# Patient Record
Sex: Female | Born: 1980 | Race: Black or African American | Hispanic: No | Marital: Married | Smoking: Never smoker
Health system: Southern US, Community
[De-identification: ages and names within clinical notes are randomized; demographics above are authoritative.]

## PROBLEM LIST (undated history)

## (undated) DIAGNOSIS — G459 Transient cerebral ischemic attack, unspecified: Secondary | ICD-10-CM

## (undated) DIAGNOSIS — R4701 Aphasia: Secondary | ICD-10-CM

## (undated) DIAGNOSIS — K829 Disease of gallbladder, unspecified: Secondary | ICD-10-CM

## (undated) DIAGNOSIS — N920 Excessive and frequent menstruation with regular cycle: Secondary | ICD-10-CM

## (undated) DIAGNOSIS — I639 Cerebral infarction, unspecified: Secondary | ICD-10-CM

## (undated) DIAGNOSIS — G510 Bell's palsy: Secondary | ICD-10-CM

## (undated) HISTORY — PX: KNEE SURGERY: SHX244

## (undated) HISTORY — DX: Aphasia: R47.01

## (undated) HISTORY — PX: OVARIAN CYST SURGERY: SHX726

## (undated) HISTORY — DX: Transient cerebral ischemic attack, unspecified: G45.9

---

## 2002-12-23 ENCOUNTER — Emergency Department (HOSPITAL_COMMUNITY): Admission: EM | Admit: 2002-12-23 | Discharge: 2002-12-23 | Payer: Self-pay | Admitting: Emergency Medicine

## 2003-04-27 ENCOUNTER — Emergency Department (HOSPITAL_COMMUNITY): Admission: EM | Admit: 2003-04-27 | Discharge: 2003-04-27 | Payer: Self-pay | Admitting: Emergency Medicine

## 2004-12-22 ENCOUNTER — Emergency Department: Payer: Self-pay | Admitting: Emergency Medicine

## 2011-12-09 ENCOUNTER — Encounter (HOSPITAL_COMMUNITY): Payer: Self-pay | Admitting: *Deleted

## 2011-12-09 ENCOUNTER — Emergency Department (HOSPITAL_COMMUNITY)

## 2011-12-09 ENCOUNTER — Inpatient Hospital Stay (HOSPITAL_COMMUNITY)
Admission: EM | Admit: 2011-12-09 | Discharge: 2011-12-12 | DRG: 880 | Disposition: A | Discharge status: Home / Self Care | Source: Ambulatory Visit | Attending: Internal Medicine | Admitting: Internal Medicine

## 2011-12-09 DIAGNOSIS — G459 Transient cerebral ischemic attack, unspecified: Secondary | ICD-10-CM | POA: Diagnosis present

## 2011-12-09 DIAGNOSIS — R4701 Aphasia: Secondary | ICD-10-CM | POA: Diagnosis present

## 2011-12-09 DIAGNOSIS — R1310 Dysphagia, unspecified: Secondary | ICD-10-CM | POA: Diagnosis present

## 2011-12-09 DIAGNOSIS — R51 Headache: Secondary | ICD-10-CM

## 2011-12-09 DIAGNOSIS — F449 Dissociative and conversion disorder, unspecified: Principal | ICD-10-CM | POA: Diagnosis present

## 2011-12-09 DIAGNOSIS — Z882 Allergy status to sulfonamides status: Secondary | ICD-10-CM

## 2011-12-09 DIAGNOSIS — G510 Bell's palsy: Secondary | ICD-10-CM | POA: Diagnosis present

## 2011-12-09 DIAGNOSIS — R29810 Facial weakness: Secondary | ICD-10-CM | POA: Diagnosis present

## 2011-12-09 HISTORY — DX: Bell's palsy: G51.0

## 2011-12-09 HISTORY — DX: Cerebral infarction, unspecified: I63.9

## 2011-12-09 LAB — COMPREHENSIVE METABOLIC PANEL
AST: 21 U/L (ref 0–37)
Albumin: 4.2 g/dL (ref 3.5–5.2)
Alkaline Phosphatase: 83 U/L (ref 39–117)
Chloride: 102 mEq/L (ref 96–112)
Potassium: 3.5 mEq/L (ref 3.5–5.1)
Sodium: 136 mEq/L (ref 135–145)
Total Bilirubin: 0.2 mg/dL — ABNORMAL LOW (ref 0.3–1.2)
Total Protein: 8.4 g/dL — ABNORMAL HIGH (ref 6.0–8.3)

## 2011-12-09 LAB — CBC
Hemoglobin: 13.5 g/dL (ref 12.0–15.0)
MCHC: 35 g/dL (ref 30.0–36.0)
Platelets: 288 10*3/uL (ref 150–400)
RDW: 13.1 % (ref 11.5–15.5)

## 2011-12-09 LAB — CK TOTAL AND CKMB (NOT AT ARMC): CK, MB: 1.4 ng/mL (ref 0.3–4.0)

## 2011-12-09 LAB — PROTIME-INR
INR: 1.13 (ref 0.00–1.49)
Prothrombin Time: 14.7 seconds (ref 11.6–15.2)

## 2011-12-09 LAB — DIFFERENTIAL
Basophils Absolute: 0 10*3/uL (ref 0.0–0.1)
Basophils Relative: 0 % (ref 0–1)
Neutro Abs: 6.6 10*3/uL (ref 1.7–7.7)
Neutrophils Relative %: 58 % (ref 43–77)

## 2011-12-09 LAB — POCT I-STAT, CHEM 8
Creatinine, Ser: 0.7 mg/dL (ref 0.50–1.10)
Glucose, Bld: 98 mg/dL (ref 70–99)
Hemoglobin: 14.3 g/dL (ref 12.0–15.0)
Potassium: 3.6 mEq/L (ref 3.5–5.1)

## 2011-12-09 LAB — APTT: aPTT: 30 seconds (ref 24–37)

## 2011-12-09 LAB — TROPONIN I: Troponin I: <0.3 ng/mL (ref <0.30)

## 2011-12-09 MED ORDER — SODIUM CHLORIDE 0.9 % IV SOLN
INTRAVENOUS | Status: DC
  Administered 2011-12-10 (×2): via INTRAVENOUS

## 2011-12-09 MED ORDER — SODIUM CHLORIDE 0.9 % IV SOLN: INTRAVENOUS | Status: DC

## 2011-12-09 NOTE — Consult Note (Signed)
Referring Physician: Dr.Bednar    Chief Complaint: stroke code  HPI: Kathryn Harding is an 31 y.o. female black who is presenting in the ER with left-sided numbness in the face. The patient has very little speech output so some of the history is coming from her mother. Her mother tells me that the patient arrived home from [place] today around 9:00pm. Around 10 in the evening she started "acting funny in her face". She started twitching on the left side of her face and neck. Her mother tells me that she had Bell's palsy in 2007 in [place] and was treated with steroids.  Her mother was told by a  sargeant later that it was actually a stroke.  Since that time the patient has had some twitching on the left side on occasion. However, this is much more extensive and unusual. The patient stopped speaking at one point at her home. Here in the ER the patient is able to answer my questions with gestures as well as using writing. In the course of the history she is able to speak a little more. The patient tells me that the left side of her face is numb and that is why she is unable to speak. She is also having trouble swallowing. When I ask her if this occurred before she writes on paper that this occurred in 2007 in [place]. Pt is not on a blood thinner.  LSN: 10:00pm, 12/09/2011  tPA Given: No: CTangio head and neck negative for clot/cva mRankin:  Past Medical History  Diagnosis Date  . Bell's palsy   . CVA (cerebral infarction)     History reviewed. No pertinent past surgical history.  History reviewed. No pertinent family history. Social History:  does not have a smoking history on file. She does not have any smokeless tobacco history on file. Her alcohol and drug histories not on file.  Allergies:  Allergies  Allergen Reactions  . Sulfur Other (See Comments)    unknown    Medications: I have reviewed the patient's current medications.  ROS: Unable to obtain  Physical Examination: Blood  pressure 122/84, temperature 98 F (36.7 C), temperature source Oral, resp. rate 16, SpO2 100.00%. General: alert and oriented to P/P/P CV: S1, S2, no S3/S4/murmur/carotid bruits Pulm: Clear to auscultation in the front fields Abd: soft  Neurologic Examination: Naming/repetition/comprehension intact, speech dysarthric and speech output significantly decreased but fluctuating throughout exam and history, able to write, unable to read; PERRL, EOMI, V1-V3 decreased to ST/PP on L, L facial droop, hearing grossly intact, SCM/trap 5/5 R, SCM 3/5 L, trap 5/5 L; tongue deviated to the left MOTOR: 5/5, normal tone Sensory: intact to ST/PP all 4 limbs Reflexes: symmetric   Results for orders placed during the hospital encounter of 12/09/11 (from the past 48 hour(s))  PROTIME-INR     Status: Normal   Collection Time   12/09/11 11:15 PM      Component Value Range Comment   Prothrombin Time 14.7  11.6 - 15.2 (seconds)    INR 1.13  0.00 - 1.49    APTT     Status: Normal   Collection Time   12/09/11 11:15 PM      Component Value Range Comment   aPTT 30  24 - 37 (seconds)   CBC     Status: Abnormal   Collection Time   12/09/11 11:15 PM      Component Value Range Comment   WBC 11.4 (*) 4.0 - 10.5 (K/uL)  RBC 4.44  3.87 - 5.11 (MIL/uL)    Hemoglobin 13.5  12.0 - 15.0 (g/dL)    HCT 16.1  09.6 - 04.5 (%)    MCV 86.9  78.0 - 100.0 (fL)    MCH 30.4  26.0 - 34.0 (pg)    MCHC 35.0  30.0 - 36.0 (g/dL)    RDW 40.9  81.1 - 91.4 (%)    Platelets 288  150 - 400 (K/uL)   DIFFERENTIAL     Status: Abnormal   Collection Time   12/09/11 11:15 PM      Component Value Range Comment   Neutrophils Relative 58  43 - 77 (%)    Neutro Abs 6.6  1.7 - 7.7 (K/uL)    Lymphocytes Relative 30  12 - 46 (%)    Lymphs Abs 3.4  0.7 - 4.0 (K/uL)    Monocytes Relative 11  3 - 12 (%)    Monocytes Absolute 1.2 (*) 0.1 - 1.0 (K/uL)    Eosinophils Relative 1  0 - 5 (%)    Eosinophils Absolute 0.2  0.0 - 0.7 (K/uL)    Basophils  Relative 0  0 - 1 (%)    Basophils Absolute 0.0  0.0 - 0.1 (K/uL)   POCT I-STAT, CHEM 8     Status: Normal   Collection Time   12/09/11 11:28 PM      Component Value Range Comment   Sodium 141  135 - 145 (mEq/L)    Potassium 3.6  3.5 - 5.1 (mEq/L)    Chloride 105  96 - 112 (mEq/L)    BUN 13  6 - 23 (mg/dL)    Creatinine, Ser 7.82  0.50 - 1.10 (mg/dL)    Glucose, Bld 98  70 - 99 (mg/dL)    Calcium, Ion 9.56  1.12 - 1.32 (mmol/L)    TCO2 24  0 - 100 (mmol/L)    Hemoglobin 14.3  12.0 - 15.0 (g/dL)    HCT 21.3  08.6 - 57.8 (%)    Ct Head Wo Contrast  12/09/2011  *RADIOLOGY REPORT*  Clinical Data: Aphasia.  Left sided facial weakness.  Headache.  CT HEAD WITHOUT CONTRAST  Technique:  Contiguous axial images were obtained from the base of the skull through the vertex without contrast.  Comparison: None.  Findings: No acute intracranial hemorrhage, infarction, or mass. Brain parenchyma is normal.  Osseous structures are normal.  IMPRESSION: Normal exam.  Original Report Authenticated By: Gwynn Burly, M.D.    Assessment: 31 y.o. female black PMH Bell's palsy and FMH positive for CVA presenting ER with sudden onset L facial droop, PE inconsistent, CT head/CTA head and neck negative for CVA/clot  Stroke Risk Factors - family history  Plan: 1. HgbA1c, fasting lipid panel 2. MRI, MRA  of the brain without contrast 3. PT consult, OT consult, Speech consult 4. Echocardiogram 5. Carotid dopplers 6. Prophylactic therapy-Antiplatelet med: Aspirin - dose 81mg  7. Risk factor modification 8. Telemetry monitoring 9. UDS  Shlomo Seres Christophe Louis, MD [facility]  12/09/2011, 11:48 PM

## 2011-12-09 NOTE — ED Notes (Signed)
Per EMS: pt noted to have sudden onset of HA, and left sided facial droop, numbness and weakness tonight at 2200.  Also states pt stopped speaking in front of mom and has not spoken since at 2200.

## 2011-12-09 NOTE — ED Provider Notes (Signed)
History     CSN: [id number]  Arrival date & time 12/09/11  2309   First MD Initiated Contact with Patient 12/09/11 2312      Chief Complaint  Patient presents with  . Code Stroke  left face weak/numb/headache aphasia onset 2200  (Consider location/radiation/quality/duration/timing/severity/associated sxs/prior treatment) HPI This 31 year old female is brought to the emergency room by EMS for sudden onset at 10:00 this evening and just over 1 hour prior to arrival of a headache associated with left-sided facial weakness and numbness and inability to speak but no receptive aphasia she is following commands and has no lateralizing weakness or numbness or incoordination of her arms or legs. She has no change in vision. She has no chest pain or shortness of breath.  Review of systems otherwise unobtainable due to severe illness riding as a code stroke. Per EMS the patient apparently has a history of both a stroke in the past as well as Bell's palsy she has recovered from both at baseline has no deficits according to EMS from the history obtained from the family prior to arrival. Past Medical History  Diagnosis Date  . Bell's palsy   . CVA (cerebral infarction)    CVA, Bell's palsy, apparently both recovered fully at baseline no deficits per EMS History reviewed. No pertinent past surgical history.  History reviewed. No pertinent family history.  History  Substance Use Topics  . Smoking status: Never Smoker   . Smokeless tobacco: Not on file  . Alcohol Use: Not on file    OB History    Grav Para Term Preterm Abortions TAB SAB Ect Mult Living                  Review of Systems  Unable to perform ROS: Other    Allergies  Sulfur  Home Medications  No current outpatient prescriptions on file.  BP 99/62  Pulse 65  Temp(Src) 97.7 F (36.5 C) (Oral)  Resp 16  Ht 5\' 1"  (1.549 m)  Wt 225 lb 8.5 oz (102.3 kg)  BMI 42.61 kg/m2  SpO2 94%  LMP 12/03/2011  Physical Exam    Nursing note and vitals reviewed. Constitutional:       Awake, alert, nontoxic appearance, follows simple commands well  HENT:  Head: Atraumatic.  Mouth/Throat: No oropharyngeal exudate.  Eyes: EOM are normal. Pupils are equal, round, and reactive to light. Right eye exhibits no discharge. Left eye exhibits no discharge.  Neck: Neck supple.  Cardiovascular: Normal rate and regular rhythm.   No murmur heard. Pulmonary/Chest: Effort normal and breath sounds normal. No stridor. No respiratory distress. She has no wheezes. She has no rales. She exhibits no tenderness.  Abdominal: Soft. Bowel sounds are normal. She exhibits no mass. There is no tenderness. There is no rebound.  Musculoskeletal: She exhibits no edema and no tenderness.       Baseline ROM, moves extremities with no obvious new focal weakness.  Lymphadenopathy:    She has no cervical adenopathy.  Neurological: She is alert.       Awake, alert, cooperative and aware of situation; motor strength bilaterally arms and legs; sensation normal to light touch bilaterally arms and legs; peripheral visual fields full to confrontation; has facial asymmetry; tongue midline; major cranial nerves appear to show a left facial numbness and droop not involving the eyelid or forehead; no pronator drift arms or legs, normal finger to nose bilaterally; no apparent receptive aphasia but is unable to speak upon arrival  Skin:  No rash noted.  Psychiatric: She has a normal mood and affect.    ED Course  Procedures (including critical care time) ECG: Sinus rhythm, ventricular rate 72, normal axis, borderline AV conduction delay with PR interval 200 ms, no acute ischemic changes noted, no comparison ECG available  0015 patient is alert he been seen by neurology in his back and CT angiogram, I discussed the case with the patient's family as well as it is uncertain whether or not the patient will be a candidate for TPA or other intervention due to the  somewhat atypical symptoms with Pt's speech improving according to neurology in the ED and she was unable to speak upon arrival but started talking to the neurologist.  0130 unassigned medicine paged after neurology determined the patient is not a candidate for intervention or tPA with a normal CT angiogram of the brain, patient is able to speak a little bit now in the emergency department which is an improvement, patient and her family understand and agree with this assessment and plan as well, the patient is active duty in the [employer] but does not have a local primary care doctor Labs Reviewed  CBC - Abnormal; Notable for the following:    WBC 11.4 (*)    All other components within normal limits  DIFFERENTIAL - Abnormal; Notable for the following:    Monocytes Absolute 1.2 (*)    All other components within normal limits  COMPREHENSIVE METABOLIC PANEL - Abnormal; Notable for the following:    Total Protein 8.4 (*)    Total Bilirubin 0.2 (*)    All other components within normal limits  GLUCOSE, CAPILLARY - Abnormal; Notable for the following:    Glucose-Capillary 107 (*)    All other components within normal limits  PROTIME-INR  APTT  CK TOTAL AND CKMB  TROPONIN I  POCT I-STAT, CHEM 8  URINE RAPID DRUG SCREEN (HOSP PERFORMED)  HEMOGLOBIN A1C  LIPID PANEL  RAPID HIV SCREEN ([facility])  LYME DISEASE DNA BY PCR([facility])   Ct Angio Head W/cm &/or Wo Cm  12/10/2011  *RADIOLOGY REPORT*  Clinical Data:  Right facial droop.  Slurred speech.  Tongue deviation to the left.  CT ANGIOGRAPHY HEAD AND NECK  Technique:  Multidetector CT imaging of the head and neck was performed using the standard protocol during bolus administration of intravenous contrast.  Multiplanar CT image reconstructions including MIPs were obtained to evaluate the vascular anatomy. Carotid stenosis measurements (when applicable) are obtained utilizing NASCET criteria, using the distal internal carotid diameter as the  denominator.  Contrast: ,50mL OMNIPAQUE IOHEXOL 350 MG/ML IV SOLN  Comparison:  CT head without contrast 12/08/2010.  CTA NECK  Findings:  There is a common origin of the left common carotid artery and the innominate artery.  The arch is otherwise unremarkable.  Both vertebral arteries originate from the subclavian arteries.  The left vertebral artery is the dominant vessel.  The the right vertebral artery is hypoplastic throughout its course in the neck.  There is no focal stenosis on either side.  The right common carotid artery is within normal limits.  The bifurcation is unremarkable.  The right internal carotid artery is normal.  The left common carotid artery is within normal limits.  The bifurcation is unremarkable.  The left internal carotid artery is normal.  The neck is otherwise unremarkable.  The lung apices are clear.   Review of the MIP images confirms the above findings.  IMPRESSION: Negative CTA of the neck.  CTA HEAD  Findings:  The internal carotid arteries are within normal limits bilaterally at the skull base.  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 The anterior communicating artery is patent and both A2 vessels fill from the left.  The M1 segments are normal.  The MCA bifurcations are within normal limits bilaterally.  ACA and MCA branch vessels are unremarkable.  The left vertebral artery is the dominant vessel.  The left PICA origin is visualized and within normal limits.  The right AICA is dominant.  The basilar artery is within normal limits.  Both posterior cerebral arteries originate from the basilar tip.  The PCA branch vessels are within normal limits bilaterally.  The dural sinuses fill normally.  No acute infarct, hemorrhage, mass lesion is evident.  No pathologic enhancement is present.   Review of the MIP images confirms the above findings.  IMPRESSION: Negative CTA of the head.  Original Report Authenticated By: Jamesetta Orleans. MATTERN, M.D.   Ct Head Wo Contrast  12/09/2011  *RADIOLOGY  REPORT*  Clinical Data: Aphasia.  Left sided facial weakness.  Headache.  CT HEAD WITHOUT CONTRAST  Technique:  Contiguous axial images were obtained from the base of the skull through the vertex without contrast.  Comparison: None.  Findings: No acute intracranial hemorrhage, infarction, or mass. Brain parenchyma is normal.  Osseous structures are normal.  IMPRESSION: Normal exam.  Original Report Authenticated By: Gwynn Burly, M.D.   Ct Angio Neck W/cm &/or Wo/cm  12/10/2011  *RADIOLOGY REPORT*  Clinical Data:  Right facial droop.  Slurred speech.  Tongue deviation to the left.  CT ANGIOGRAPHY HEAD AND NECK  Technique:  Multidetector CT imaging of the head and neck was performed using the standard protocol during bolus administration of intravenous contrast.  Multiplanar CT image reconstructions including MIPs were obtained to evaluate the vascular anatomy. Carotid stenosis measurements (when applicable) are obtained utilizing NASCET criteria, using the distal internal carotid diameter as the denominator.  Contrast: ,50mL OMNIPAQUE IOHEXOL 350 MG/ML IV SOLN  Comparison:  CT head without contrast 12/08/2010.  CTA NECK  Findings:  There is a common origin of the left common carotid artery and the innominate artery.  The arch is otherwise unremarkable.  Both vertebral arteries originate from the subclavian arteries.  The left vertebral artery is the dominant vessel.  The the right vertebral artery is hypoplastic throughout its course in the neck.  There is no focal stenosis on either side.  The right common carotid artery is within normal limits.  The bifurcation is unremarkable.  The right internal carotid artery is normal.  The left common carotid artery is within normal limits.  The bifurcation is unremarkable.  The left internal carotid artery is normal.  The neck is otherwise unremarkable.  The lung apices are clear.   Review of the MIP images confirms the above findings.  IMPRESSION: Negative CTA of the  neck.  CTA HEAD  Findings:  The internal carotid arteries are within normal limits bilaterally at the skull base.  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 The anterior communicating artery is patent and both A2 vessels fill from the left.  The M1 segments are normal.  The MCA bifurcations are within normal limits bilaterally.  ACA and MCA branch vessels are unremarkable.  The left vertebral artery is the dominant vessel.  The left PICA origin is visualized and within normal limits.  The right AICA is dominant.  The basilar artery is within normal limits.  Both posterior cerebral arteries  originate from the basilar tip.  The PCA branch vessels are within normal limits bilaterally.  The dural sinuses fill normally.  No acute infarct, hemorrhage, mass lesion is evident.  No pathologic enhancement is present.   Review of the MIP images confirms the above findings.  IMPRESSION: Negative CTA of the head.  Original Report Authenticated By: Jamesetta Orleans. MATTERN, M.D.   Mr Brain Wo Contrast  12/10/2011  *RADIOLOGY REPORT*  Clinical Data:  Stroke.  Aphasia preceded by twitching of the left side of face.  MRI HEAD WITHOUT CONTRAST MRA HEAD WITHOUT CONTRAST  Technique:  Multiplanar, multiecho pulse sequences of the brain and surrounding structures were obtained without intravenous contrast. Angiographic images of the head were obtained using MRA technique without contrast.  Comparison:  CTA head and neck 12/10/2011  MRI HEAD  Findings:  The diffusion weighted images demonstrate no evidence for acute or subacute infarction.  There is no evidence for significant to remote ischemia.  No hemorrhage or mass lesion is present.  The ventricles are of normal size.  No significant extra-axial fluid collection is present.  Flow is present in the major intracranial arteries.  The globes and orbits are intact.  The paranasal sinuses and mastoid air cells are clear.  IMPRESSION: Negative MRI of the brain.  MRA HEAD  Findings: The study is  mildly degraded by patient motion.  The internal carotid arteries are within normal limits bilaterally. 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  Both ACA vessels fill from the left via a patent anterior communicating artery.  The MCA bifurcations are normal.  The ACA and MCA branch vessels are within normal limits.  The left vertebral artery is the dominant vessel.  The basilar artery is normal.  Both posterior cerebral arteries originate from basilar tip.  The PCA branch vessels are normal.  IMPRESSION: Normal variant MRA circle of Willis without evidence for significant proximal stenosis, aneurysm, or branch vessel occlusion.  Original Report Authenticated By: Jamesetta Orleans. MATTERN, M.D.   Mr Maxine Glenn Head/brain Wo Cm  12/10/2011  *RADIOLOGY REPORT*  Clinical Data:  Stroke.  Aphasia preceded by twitching of the left side of face.  MRI HEAD WITHOUT CONTRAST MRA HEAD WITHOUT CONTRAST  Technique:  Multiplanar, multiecho pulse sequences of the brain and surrounding structures were obtained without intravenous contrast. Angiographic images of the head were obtained using MRA technique without contrast.  Comparison:  CTA head and neck 12/10/2011  MRI HEAD  Findings:  The diffusion weighted images demonstrate no evidence for acute or subacute infarction.  There is no evidence for significant to remote ischemia.  No hemorrhage or mass lesion is present.  The ventricles are of normal size.  No significant extra-axial fluid collection is present.  Flow is present in the major intracranial arteries.  The globes and orbits are intact.  The paranasal sinuses and mastoid air cells are clear.  IMPRESSION: Negative MRI of the brain.  MRA HEAD  Findings: The study is mildly degraded by patient motion.  The internal carotid arteries are within normal limits bilaterally. 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  Both ACA vessels fill from the left via a patent anterior communicating artery.  The MCA bifurcations are normal.  The ACA and MCA branch  vessels are within normal limits.  The left vertebral artery is the dominant vessel.  The basilar artery is normal.  Both posterior cerebral arteries originate from basilar tip.  The PCA branch vessels are normal.  IMPRESSION: Normal variant MRA circle of Willis  without evidence for significant proximal stenosis, aneurysm, or branch vessel occlusion.  Original Report Authenticated By: Jamesetta Orleans. MATTERN, M.D.     1. Headache   2. Aphasia   3. Facial weakness   4. TIA (transient ischemic attack)       MDM          Hurman Horn, MD 12/11/11 0230

## 2011-12-10 ENCOUNTER — Emergency Department (HOSPITAL_COMMUNITY)

## 2011-12-10 ENCOUNTER — Other Ambulatory Visit (HOSPITAL_COMMUNITY): Payer: Self-pay

## 2011-12-10 ENCOUNTER — Inpatient Hospital Stay (HOSPITAL_COMMUNITY)

## 2011-12-10 ENCOUNTER — Other Ambulatory Visit: Payer: Self-pay

## 2011-12-10 ENCOUNTER — Encounter (HOSPITAL_COMMUNITY): Payer: Self-pay | Admitting: Radiology

## 2011-12-10 DIAGNOSIS — G459 Transient cerebral ischemic attack, unspecified: Secondary | ICD-10-CM | POA: Diagnosis present

## 2011-12-10 DIAGNOSIS — R4701 Aphasia: Secondary | ICD-10-CM

## 2011-12-10 HISTORY — DX: Transient cerebral ischemic attack, unspecified: G45.9

## 2011-12-10 HISTORY — DX: Aphasia: R47.01

## 2011-12-10 LAB — HEMOGLOBIN A1C: Mean Plasma Glucose: 114 mg/dL (ref <117)

## 2011-12-10 LAB — LIPID PANEL
Cholesterol: 136 mg/dL (ref 0–200)
Total CHOL/HDL Ratio: 3.2 RATIO
VLDL: 13 mg/dL (ref 0–40)

## 2011-12-10 LAB — RAPID URINE DRUG SCREEN, HOSP PERFORMED
Amphetamines: NOT DETECTED
Benzodiazepines: NOT DETECTED
Opiates: NOT DETECTED

## 2011-12-10 LAB — GLUCOSE, CAPILLARY

## 2011-12-10 LAB — RAPID HIV SCREEN (WH-MAU): Rapid HIV Screen: NONREACTIVE

## 2011-12-10 MED ORDER — ALPRAZOLAM 0.5 MG PO TABS
0.5000 mg | ORAL_TABLET | Freq: Two times a day (BID) | ORAL | Status: DC | PRN
Start: 2011-12-10 — End: 2011-12-12
  Administered 2011-12-10 (×2): 0.5 mg via ORAL
  Filled 2011-12-10 (×2): qty 1

## 2011-12-10 MED ORDER — ACETAMINOPHEN 325 MG PO TABS: 650.0000 mg | ORAL_TABLET | ORAL | Status: DC | PRN

## 2011-12-10 MED ORDER — ACETAMINOPHEN 650 MG RE SUPP: 650.0000 mg | RECTAL | Status: DC | PRN

## 2011-12-10 MED ORDER — PANTOPRAZOLE SODIUM 40 MG IV SOLR
40.0000 mg | Freq: Every day | INTRAVENOUS | Status: DC
  Administered 2011-12-10: 40 mg via INTRAVENOUS
  Filled 2011-12-10 (×2): qty 40

## 2011-12-10 MED ORDER — ONDANSETRON HCL 4 MG/2ML IJ SOLN: 4.0000 mg | Freq: Four times a day (QID) | INTRAMUSCULAR | Status: DC | PRN

## 2011-12-10 MED ORDER — SENNOSIDES-DOCUSATE SODIUM 8.6-50 MG PO TABS: 1.0000 | ORAL_TABLET | Freq: Every evening | ORAL | Status: DC | PRN

## 2011-12-10 MED ORDER — IOHEXOL 350 MG/ML SOLN
50.0000 mL | Freq: Once | INTRAVENOUS | Status: AC | PRN
  Administered 2011-12-10: 50 mL via INTRAVENOUS

## 2011-12-10 MED ORDER — ASPIRIN 81 MG PO CHEW
81.0000 mg | CHEWABLE_TABLET | Freq: Every day | ORAL | Status: DC
  Administered 2011-12-10 – 2011-12-12 (×3): 81 mg via ORAL
  Filled 2011-12-10 (×3): qty 1

## 2011-12-10 NOTE — ED Notes (Signed)
Admitting MD speaking with pt. at this time .  
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
## 2011-12-10 NOTE — Progress Notes (Signed)
*  PRELIMINARY RESULTS* Vascular Ultrasound Carotid Duplex (Doppler) has been completed.  Preliminary findings: Bilateral:  No evidence of hemodynamically significant internal carotid artery stenosis.   Vertebral artery flow is antegrade.      Farrel Demark  RDMS 12/10/2011, 9:22 AM

## 2011-12-10 NOTE — Progress Notes (Signed)
Physical Therapy Evaluation Patient Details Name: Kathryn Harding MRN: [id number] DOB: Mar 23, 1981 Today's Date: 12/10/2011  Problem List:  Patient Active Problem List  Diagnoses  . Aphasia  . TIA (transient ischemic attack)    Past Medical History:  Past Medical History  Diagnosis Date  . Bell's palsy   . CVA (cerebral infarction)    Past Surgical History: History reviewed. No pertinent past surgical history.  PT Assessment/Plan/Recommendation PT Assessment Clinical Impression Statement: Pt presents with a medical diagnosis of aphasia with possible bell's palsy/seiaure/TIA. Pt is at her baseline functional mobility level therefore no further PT needs PT Recommendation/Assessment: Patent does not need any further PT services No Skilled PT: Patient at baseline level of functioning;All education completed PT Recommendation Follow Up Recommendations: No PT follow up Equipment Recommended: None recommended by PT PT Goals     PT Evaluation Precautions/Restrictions    Prior Functioning  Home Living Lives With: Alone (stationed in [place]) Receives Help From: Family (going to moms house upon d/c) Type of Home: House Home Layout: One level Home Access: Stairs to enter Entrance Stairs-Rails: None Entrance Stairs-Number of Steps: 1 Bathroom Shower/Tub: Forensic scientist: Standard Bathroom Accessibility: Yes How Accessible: Accessible via [place] Home Adaptive Equipment: None Prior Function Level of Independence: Independent with basic ADLs;Independent with homemaking with ambulation;Independent with gait;Independent with transfers Able to Take Stairs?: Yes Driving: Yes Vocation: Full time employment Cognition Cognition Arousal/Alertness: Awake/alert Overall Cognitive Status: Appears within functional limits for tasks assessed Orientation Level: Oriented X4 Sensation/Coordination Sensation Light Touch: Appears Intact Coordination Gross Motor  Movements are Fluid and Coordinated: Yes Fine Motor Movements are Fluid and Coordinated: Yes Extremity Assessment RLE Assessment RLE Assessment: Within Functional Limits LLE Assessment LLE Assessment: Within Functional Limits Mobility (including Balance) Bed Mobility Bed Mobility: Yes Supine to Sit: 7: Independent Sitting - Scoot to Edge of Bed: 7: Independent Transfers Transfers: Yes Sit to Stand: 7: Independent Stand to Sit: 7: Independent Ambulation/Gait Ambulation/Gait: Yes Ambulation/Gait Assistance: 6: Modified independent (Device/Increase time) Ambulation Distance (Feet): 100 Feet Assistive device: None Gait Pattern: Within Functional Limits Gait velocity: Normal gait speed Stairs: No    Exercise    End of Session PT - End of Session Equipment Utilized During Treatment: Gait belt Activity Tolerance: Patient tolerated treatment well Patient left: in chair;with call bell in reach;with family/visitor present Nurse Communication: Mobility status for transfers;Mobility status for ambulation General Behavior During Session: So [facility] for tasks performed Cognition: [facility] for tasks performed  Milana Kidney 12/10/2011, 5:16 PM  12/10/2011 Milana Kidney DPT PAGER: [phone] OFFICE: [phone]

## 2011-12-10 NOTE — Consult Note (Addendum)
Dr. Bertram Denver note is in the incomplete section. Please follow her recommendations. We will follow up when more of a work-up is done.   Carmell Austria, MD

## 2011-12-10 NOTE — Progress Notes (Signed)
Department of Spiritual Care and Wholeness Responded to request of ED to provide pastoral presence to patient and family present.  Offered comfort measures and support.  Family is appreciative of all support.  Will continue to follow-up for additional pastoral support as patient is transferred to [address]. Janell Quiet, On-Call Chaplain  [phone]

## 2011-12-10 NOTE — Progress Notes (Signed)
Pt seen and examined agree with recommendations by Dr.Doutova, My concern is for Bells palsy, esp since MRI negative,check HIV, lyme serology Continue Work up/EEG as recommended by Neuro

## 2011-12-10 NOTE — H&P (Signed)
PCP:  None   Chief Complaint:  aphasia  HPI: Kathryn Harding is a 31 y.o. female   has a past medical history of Bell's palsy and CVA (cerebral infarction). (this is questionable)  Presented with  Aphasia started around 10 pm preceded with twitching of left face. Since her [facility] her aphasia had somewhat improved and currently she is able to speak. She was initially evaluated as cold stroke but no TPA was given as the symptoms started to improve. She has been seen by neurology will continue to follow up.  In 2007 she has similar episde that thought to be bells palsy vs. TIA. Every since she had had mild proptosis noted on the left. Today she is noted to have significant right facial droop and persistent left eye proptosis. no gait abnormality, no localized weakness. Patient states that the left side of her face is numb.  Review of Systems:    Pertinent positives include: recent cold reports double vision,   Constitutional:  No weight loss, night sweats, Fevers, chills, fatigue.  HEENT:  No headaches, Difficulty swallowing,Tooth/dental problems,Sore throat,  No sneezing, itching, ear ache, nasal congestion, post nasal drip,  Cardio-vascular:  No chest pain, Orthopnea, PND, anasarca, dizziness, palpitations.no Bilateral lower extremity swelling  GI:  No heartburn, indigestion, abdominal pain, nausea, vomiting, diarrhea, change in bowel habits, loss of appetite, melena, blood in stool, hematoemesis Resp:  no shortness of breath at rest. No dyspnea on exertion, No excess mucus, no productive cough, No non-productive cough, No coughing up of blood.No change in color of mucus.No wheezing.No chest wall deformity  Skin:  no rash or lesions.  GU:  no dysuria, change in color of urine, no urgency or frequency. No flank pain.  Musculoskeletal:  No joint pain or swelling. No decreased range of motion. No back pain.  Psych:  No change in mood or affect. No depression or  anxiety. No memory loss.  Neuro:no slurred speech   Otherwise ROS are negative except for above, 10 systems were reviewed  Past Medical History: Past Medical History  Diagnosis Date  . Bell's palsy   . CVA (cerebral infarction)    History reviewed. No pertinent past surgical history.   Medications: Prior to Admission medications   Not on File    Allergies:   Allergies  Allergen Reactions  . Sulfur Other (See Comments)    unknown    Social History:  Ambulatory independently Lives [place] [employer] [place] in [place], she is on active duty.    reports that she has never smoked. She does not have any smokeless tobacco history on file.   Family History: family history is not on file.  Noncontributory  Physical Exam: Patient Vitals for the past 24 hrs:  BP Temp Temp src Resp SpO2  12/09/11 2325 122/84 mmHg 98 F (36.7 C) Oral 16  100 %    1. General:  in No Acute distress 2. Psychological: Alert and Oriented 3. Head/ENT:   Moist Mucous Membranes                          Head Non traumatic, neck supple                          Normal  Dentition 4. SKIN: normal Skin turgor,  Skin clean Dry and intact no rash 5. Heart: Regular rate and rhythm no Murmur, Rub or gallop 6. Lungs: Clear to  auscultation bilaterally, no wheezes or crackles   7. Abdomen: Soft, non-tender, Non distended 8. Lower extremities: no clubbing, cyanosis, or edema 9. Neurologically  moving all 4 extremities equally, strength 5 out of 5 in all 4 extremities. There is a pronounced right facial droop noted. Patient has difficulty speaking although able to verbalize somewhat now. 10. MSK: Normal range of motion  body mass index is unknown because there is no height or weight on file.   Labs on Admission:   [facility] 12/09/11 2328 12/09/11 2315  NA 141 136  K 3.6 3.5  CL 105 102  CO2 -- 25  GLUCOSE 98 98  BUN 13 13  CREATININE 0.70 0.67  CALCIUM -- 10.1  MG -- --  PHOS -- --    Basename 12/09/11  2315  AST 21  ALT 11  ALKPHOS 83  BILITOT 0.2*  PROT 8.4*  ALBUMIN 4.2   No results found for this basename: LIPASE:2,AMYLASE:2 in the last 72 hours  Basename 12/09/11 2328 12/09/11 2315  WBC -- 11.4*  NEUTROABS -- 6.6  HGB 14.3 13.5  HCT 42.0 38.6  MCV -- 86.9  PLT -- 288    Basename 12/09/11 2315  CKTOTAL 81  CKMB 1.4  CKMBINDEX --  TROPONINI <0.30   No results found for this basename: TSH,T4TOTAL,FREET3,T3FREE,THYROIDAB in the last 72 hours No results found for this basename: VITAMINB12:2,FOLATE:2,FERRITIN:2,TIBC:2,IRON:2,RETICCTPCT:2 in the last 72 hours No results found for this basename: HGBA1C    CrCl is unknown because there is no height on file for the current visit. ABG    Component Value Date/Time   TCO2 24 12/09/2011 2328     No results found for this basename: DDIMER     Other results:  I have pearsonaly reviewed this: ECG REPORT  Rate:72  Rhythm: Normal sinus rhythm ST&T Change: T wave inversion in lead V1 and V2     Radiological Exams on Admission: Ct Head Wo Contrast  12/09/2011  *RADIOLOGY REPORT*  Clinical Data: Aphasia.  Left sided facial weakness.  Headache.  CT HEAD WITHOUT CONTRAST  Technique:  Contiguous axial images were obtained from the base of the skull through the vertex without contrast.  Comparison: None.  Findings: No acute intracranial hemorrhage, infarction, or mass. Brain parenchyma is normal.  Osseous structures are normal.  IMPRESSION: Normal exam.  Original Report Authenticated By: Gwynn Burly, M.D.    Assessment/Plan  31 year old female with past  history of Bell's palsy versus TIA in the past here with symptoms concerning for CVA vs TIA  Present on Admission:  .Aphasia - we'll and Fridays for CVA/TIA. Appreciate neurology consult. PT/OT consult SP consult .TIA/CVA (transient ischemic attack) - will admit to watch in telemetry obtain MRI/MRA with/out contrast start on 81 mg by mouth aspirin. Get echo gram and carotid  Dopplers. Will probably need speech pathology evaluation.   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   Cree Kunert 12/10/2011, 1:58 AM

## 2011-12-11 MED ORDER — PANTOPRAZOLE SODIUM 40 MG PO TBEC
40.0000 mg | DELAYED_RELEASE_TABLET | Freq: Every day | ORAL | Status: DC
  Administered 2011-12-11 – 2011-12-12 (×2): 40 mg via ORAL
  Filled 2011-12-11 (×2): qty 1

## 2011-12-11 MED ORDER — PREDNISONE 50 MG PO TABS
50.0000 mg | ORAL_TABLET | Freq: Every day | ORAL | Status: DC
  Administered 2011-12-11 – 2011-12-12 (×2): 50 mg via ORAL
  Filled 2011-12-11 (×3): qty 1

## 2011-12-11 NOTE — Progress Notes (Signed)
Subjective: Doing ok, still with L facial weakness, numbness  Objective: Vital signs in last 24 hours: Temp:  [97.7 F (36.5 C)-98 F (36.7 C)] 97.9 F (36.6 C) (02/03 1003) Pulse Rate:  [55-65] 62  (02/03 1003) Resp:  [16-20] 20  (02/03 1003) BP: (89-118)/(53-75) 111/65 mmHg (02/03 1003) SpO2:  [93 %-98 %] 94 % (02/03 1003) Weight change:  Last BM Date: 12/09/11  Intake/Output from previous day: 02/02 0701 - 02/03 0700 In: 240 [P.O.:240] Out: 400 [Urine:400]     Physical Exam: General: Alert, awake, oriented x3, in no acute distress. HEENT: No bruits, no goiter., angle of mouth deviated to R, loss of naso-labial fold, tongue deviated to L Heart: Regular rate and rhythm, without murmurs, rubs, gallops. Lungs: Clear to auscultation bilaterally. Abdomen: Soft, nontender, nondistended, positive bowel sounds. Extremities: No clubbing cyanosis or edema with positive pedal pulses. Neuro: L facial nerve paresis   Lab Results: Basic Metabolic Panel:  Basename 12/09/11 2328 12/09/11 2315  NA 141 136  K 3.6 3.5  CL 105 102  CO2 -- 25  GLUCOSE 98 98  BUN 13 13  CREATININE 0.70 0.67  CALCIUM -- 10.1  MG -- --  PHOS -- --   Liver Function Tests:  Basename 12/09/11 2315  AST 21  ALT 11  ALKPHOS 83  BILITOT 0.2*  PROT 8.4*  ALBUMIN 4.2   No results found for this basename: LIPASE:2,AMYLASE:2 in the last 72 hours No results found for this basename: AMMONIA:2 in the last 72 hours CBC:  Basename 12/09/11 2328 12/09/11 2315  WBC -- 11.4*  NEUTROABS -- 6.6  HGB 14.3 13.5  HCT 42.0 38.6  MCV -- 86.9  PLT -- 288   Cardiac Enzymes:  Basename 12/09/11 2315  CKTOTAL 81  CKMB 1.4  CKMBINDEX --  TROPONINI <0.30   BNP: No results found for this basename: PROBNP:3 in the last 72 hours D-Dimer: No results found for this basename: DDIMER:2 in the last 72 hours CBG:  Basename 12/09/11 2323  GLUCAP 107*   Hemoglobin A1C:  Basename 12/10/11 0731  HGBA1C 5.6    Fasting Lipid Panel:  Basename 12/10/11 0731  CHOL 136  HDL 43  LDLCALC 80  TRIG 67  CHOLHDL 3.2  LDLDIRECT --   Thyroid Function Tests: No results found for this basename: TSH,T4TOTAL,FREET4,T3FREE,THYROIDAB in the last 72 hours Anemia Panel: No results found for this basename: VITAMINB12,FOLATE,FERRITIN,TIBC,IRON,RETICCTPCT in the last 72 hours Coagulation:  Basename 12/09/11 2315  LABPROT 14.7  INR 1.13   Urine Drug Screen: Drugs of Abuse     Component Value Date/Time   LABOPIA NONE DETECTED 12/10/2011 0133   COCAINSCRNUR NONE DETECTED 12/10/2011 0133   LABBENZ NONE DETECTED 12/10/2011 0133   AMPHETMU NONE DETECTED 12/10/2011 0133   THCU NONE DETECTED 12/10/2011 0133   LABBARB NONE DETECTED 12/10/2011 0133    Alcohol Level: No results found for this basename: ETH:2 in the last 72 hours Urinalysis: No results found for this basename: COLORURINE:2,APPERANCEUR:2,LABSPEC:2,PHURINE:2,GLUCOSEU:2,HGBUR:2,BILIRUBINUR:2,KETONESUR:2,PROTEINUR:2,UROBILINOGEN:2,NITRITE:2,LEUKOCYTESUR:2 in the last 72 hours  No results found for this or any previous visit (from the past 240 hour(s)).  Studies/Results: Ct Angio Head W/cm &/or Wo Cm  12/10/2011  *RADIOLOGY REPORT*  Clinical Data:  Right facial droop.  Slurred speech.  Tongue deviation to the left.  CT ANGIOGRAPHY HEAD AND NECK  Technique:  Multidetector CT imaging of the head and neck was performed using the standard protocol during bolus administration of intravenous contrast.  Multiplanar CT image reconstructions including MIPs were obtained to  evaluate the vascular anatomy. Carotid stenosis measurements (when applicable) are obtained utilizing NASCET criteria, using the distal internal carotid diameter as the denominator.  Contrast: ,50mL OMNIPAQUE IOHEXOL 350 MG/ML IV SOLN  Comparison:  CT head without contrast 12/08/2010.  CTA NECK  Findings:  There is a common origin of the left common carotid artery and the innominate artery.  The arch is  otherwise unremarkable.  Both vertebral arteries originate from the subclavian arteries.  The left vertebral artery is the dominant vessel.  The the right vertebral artery is hypoplastic throughout its course in the neck.  There is no focal stenosis on either side.  The right common carotid artery is within normal limits.  The bifurcation is unremarkable.  The right internal carotid artery is normal.  The left common carotid artery is within normal limits.  The bifurcation is unremarkable.  The left internal carotid artery is normal.  The neck is otherwise unremarkable.  The lung apices are clear.   Review of the MIP images confirms the above findings.  IMPRESSION: Negative CTA of the neck.  CTA HEAD  Findings:  The internal carotid arteries are within normal limits bilaterally at the skull base.  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 The anterior communicating artery is patent and both A2 vessels fill from the left.  The M1 segments are normal.  The MCA bifurcations are within normal limits bilaterally.  ACA and MCA branch vessels are unremarkable.  The left vertebral artery is the dominant vessel.  The left PICA origin is visualized and within normal limits.  The right AICA is dominant.  The basilar artery is within normal limits.  Both posterior cerebral arteries originate from the basilar tip.  The PCA branch vessels are within normal limits bilaterally.  The dural sinuses fill normally.  No acute infarct, hemorrhage, mass lesion is evident.  No pathologic enhancement is present.   Review of the MIP images confirms the above findings.  IMPRESSION: Negative CTA of the head.  Original Report Authenticated By: Jamesetta Orleans. MATTERN, M.D.   Ct Head Wo Contrast  12/09/2011  *RADIOLOGY REPORT*  Clinical Data: Aphasia.  Left sided facial weakness.  Headache.  CT HEAD WITHOUT CONTRAST  Technique:  Contiguous axial images were obtained from the base of the skull through the vertex without contrast.  Comparison: None.   Findings: No acute intracranial hemorrhage, infarction, or mass. Brain parenchyma is normal.  Osseous structures are normal.  IMPRESSION: Normal exam.  Original Report Authenticated By: Gwynn Burly, M.D.   Ct Angio Neck W/cm &/or Wo/cm  12/10/2011  *RADIOLOGY REPORT*  Clinical Data:  Right facial droop.  Slurred speech.  Tongue deviation to the left.  CT ANGIOGRAPHY HEAD AND NECK  Technique:  Multidetector CT imaging of the head and neck was performed using the standard protocol during bolus administration of intravenous contrast.  Multiplanar CT image reconstructions including MIPs were obtained to evaluate the vascular anatomy. Carotid stenosis measurements (when applicable) are obtained utilizing NASCET criteria, using the distal internal carotid diameter as the denominator.  Contrast: ,50mL OMNIPAQUE IOHEXOL 350 MG/ML IV SOLN  Comparison:  CT head without contrast 12/08/2010.  CTA NECK  Findings:  There is a common origin of the left common carotid artery and the innominate artery.  The arch is otherwise unremarkable.  Both vertebral arteries originate from the subclavian arteries.  The left vertebral artery is the dominant vessel.  The the right vertebral artery is hypoplastic throughout its course in the neck.  There  is no focal stenosis on either side.  The right common carotid artery is within normal limits.  The bifurcation is unremarkable.  The right internal carotid artery is normal.  The left common carotid artery is within normal limits.  The bifurcation is unremarkable.  The left internal carotid artery is normal.  The neck is otherwise unremarkable.  The lung apices are clear.   Review of the MIP images confirms the above findings.  IMPRESSION: Negative CTA of the neck.  CTA HEAD  Findings:  The internal carotid arteries are within normal limits bilaterally at the skull base.  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 The anterior communicating artery is patent and both A2 vessels fill from the left.   The M1 segments are normal.  The MCA bifurcations are within normal limits bilaterally.  ACA and MCA branch vessels are unremarkable.  The left vertebral artery is the dominant vessel.  The left PICA origin is visualized and within normal limits.  The right AICA is dominant.  The basilar artery is within normal limits.  Both posterior cerebral arteries originate from the basilar tip.  The PCA branch vessels are within normal limits bilaterally.  The dural sinuses fill normally.  No acute infarct, hemorrhage, mass lesion is evident.  No pathologic enhancement is present.   Review of the MIP images confirms the above findings.  IMPRESSION: Negative CTA of the head.  Original Report Authenticated By: Jamesetta Orleans. MATTERN, M.D.   Mr Brain Wo Contrast  12/10/2011  *RADIOLOGY REPORT*  Clinical Data:  Stroke.  Aphasia preceded by twitching of the left side of face.  MRI HEAD WITHOUT CONTRAST MRA HEAD WITHOUT CONTRAST  Technique:  Multiplanar, multiecho pulse sequences of the brain and surrounding structures were obtained without intravenous contrast. Angiographic images of the head were obtained using MRA technique without contrast.  Comparison:  CTA head and neck 12/10/2011  MRI HEAD  Findings:  The diffusion weighted images demonstrate no evidence for acute or subacute infarction.  There is no evidence for significant to remote ischemia.  No hemorrhage or mass lesion is present.  The ventricles are of normal size.  No significant extra-axial fluid collection is present.  Flow is present in the major intracranial arteries.  The globes and orbits are intact.  The paranasal sinuses and mastoid air cells are clear.  IMPRESSION: Negative MRI of the brain.  MRA HEAD  Findings: The study is mildly degraded by patient motion.  The internal carotid arteries are within normal limits bilaterally. 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  Both ACA vessels fill from the left via a patent anterior communicating artery.  The MCA  bifurcations are normal.  The ACA and MCA branch vessels are within normal limits.  The left vertebral artery is the dominant vessel.  The basilar artery is normal.  Both posterior cerebral arteries originate from basilar tip.  The PCA branch vessels are normal.  IMPRESSION: Normal variant MRA circle of Willis without evidence for significant proximal stenosis, aneurysm, or branch vessel occlusion.  Original Report Authenticated By: Jamesetta Orleans. MATTERN, M.D.   Mr Maxine Glenn Head/brain Wo Cm  12/10/2011  *RADIOLOGY REPORT*  Clinical Data:  Stroke.  Aphasia preceded by twitching of the left side of face.  MRI HEAD WITHOUT CONTRAST MRA HEAD WITHOUT CONTRAST  Technique:  Multiplanar, multiecho pulse sequences of the brain and surrounding structures were obtained without intravenous contrast. Angiographic images of the head were obtained using MRA technique without contrast.  Comparison:  CTA head and  neck 12/10/2011  MRI HEAD  Findings:  The diffusion weighted images demonstrate no evidence for acute or subacute infarction.  There is no evidence for significant to remote ischemia.  No hemorrhage or mass lesion is present.  The ventricles are of normal size.  No significant extra-axial fluid collection is present.  Flow is present in the major intracranial arteries.  The globes and orbits are intact.  The paranasal sinuses and mastoid air cells are clear.  IMPRESSION: Negative MRI of the brain.  MRA HEAD  Findings: The study is mildly degraded by patient motion.  The internal carotid arteries are within normal limits bilaterally. 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  Both ACA vessels fill from the left via a patent anterior communicating artery.  The MCA bifurcations are normal.  The ACA and MCA branch vessels are within normal limits.  The left vertebral artery is the dominant vessel.  The basilar artery is normal.  Both posterior cerebral arteries originate from basilar tip.  The PCA branch vessels are normal.  IMPRESSION:  Normal variant MRA circle of Willis without evidence for significant proximal stenosis, aneurysm, or branch vessel occlusion.  Original Report Authenticated By: Jamesetta Orleans. MATTERN, M.D.    Medications: Scheduled Meds:   . aspirin  81 mg Oral Daily  . pantoprazole (PROTONIX) IV  40 mg Intravenous QHS   Continuous Infusions:  PRN Meds:.acetaminophen, acetaminophen, ALPRAZolam, ondansetron (ZOFRAN) IV, senna-docusate  Assessment/Plan: 1. Bells palsy: start Prednisone 50mg  today, MRI/MRA negative, HIV negative, lyme serology pending Neuro recommends EEG due to concern for possible seizures also, Suspect EEG will be done on tomorrow. 2. Dysphagia: due to 1, dysphagia 3 diet   LOS: 2 days   Tanesha Arambula [facility]  12/11/2011, 11:25 AM

## 2011-12-12 ENCOUNTER — Inpatient Hospital Stay (HOSPITAL_COMMUNITY)

## 2011-12-12 NOTE — Progress Notes (Signed)
[facility] PROGRESS NOTE    SUBJECTIVE   No change per patient. Continues to have decreased sensation over her left face  OBJECTIVE   Vital signs in last 24 hours: Temp:  [97.8 F (36.6 C)-98.4 F (36.9 C)] 97.8 F (36.6 C) (02/04 0600) Pulse Rate:  [56-81] 59  (02/04 0600) Resp:  [17-20] 18  (02/04 0600) BP: (95-120)/(55-77) 95/55 mmHg (02/04 0600) SpO2:  [94 %-97 %] 94 % (02/04 0600)  Intake/Output from previous day:   Intake/Output this shift: Total I/O In: 240 [P.O.:240] Out: -  Nutritional status: General  Past Medical History  Diagnosis Date  . Bell's palsy   . CVA (cerebral infarction)     Neurologic Exam:   Mental Status: Alert, oriented, thought content appropriate.  Speech slurred without evidence of aphasia. Able to follow 3 step commands without difficulty. Cranial Nerves: II-Visual fields grossly intact. III/IV/VI-Extraocular movements intact.  Pupils reactive bilaterally. Left eye shows weakness with complete closure where right eye shows full strength V/VII-Smile asymmetric with decreased right NL fold.  The left aspect of the face shows increased left NL fold and patient tends to hold face in that position. When asked to smile she can move her right corner of her mouth VIII-grossly intact--with decreased sensation on the left IX/X-normal gag XI-bilateral shoulder shrug XII-midline tongue extension Motor: 5/5 bilaterally with normal tone and bulk Sensory: Pinprick and light touch intact throughout, bilaterally Deep Tendon Reflexes: depressed throughout and symmetric throughout Plantars: Downgoing bilaterally Cerebellar: Normal finger-to-nose, normal rapid alternating movements and normal heel-to-shin test.  Normal gait and station.  Lab Results: Lab Results  Component Value Date/Time   CHOL 136 12/10/2011  7:31 AM   Lipid Panel  Basename 12/10/11 0731  CHOL 136  TRIG 67  HDL 43  CHOLHDL 3.2    VLDL 13  LDLCALC 80    Studies/Results: Mr Brain Wo Contrast  12/10/2011  *RADIOLOGY REPORT*  Clinical Data:  Stroke.  Aphasia preceded by twitching of the left side of face.  MRI HEAD WITHOUT CONTRAST MRA HEAD WITHOUT CONTRAST  Technique:  Multiplanar, multiecho pulse sequences of the brain and surrounding structures were obtained without intravenous contrast. Angiographic images of the head were obtained using MRA technique without contrast.  Comparison:  CTA head and neck 12/10/2011  MRI HEAD  Findings:  The diffusion weighted images demonstrate no evidence for acute or subacute infarction.  There is no evidence for significant to remote ischemia.  No hemorrhage or mass lesion is present.  The ventricles are of normal size.  No significant extra-axial fluid collection is present.  Flow is present in the major intracranial arteries.  The globes and orbits are intact.  The paranasal sinuses and mastoid air cells are clear.  IMPRESSION: Negative MRI of the brain.  MRA HEAD  Findings: The study is mildly degraded by patient motion.  The internal carotid arteries are within normal limits bilaterally. 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  Both ACA vessels fill from the left via a patent anterior communicating artery.  The MCA bifurcations are normal.  The ACA and MCA branch vessels are within normal limits.  The left vertebral artery is the dominant vessel.  The basilar artery is normal.  Both posterior cerebral arteries originate from basilar tip.  The  PCA branch vessels are normal.  IMPRESSION: Normal variant MRA circle of Willis without evidence for significant proximal stenosis, aneurysm, or branch vessel occlusion.  Original Report Authenticated By: Jamesetta Orleans. MATTERN, M.D.   Mr Maxine Glenn Head/brain Wo Cm  12/10/2011  *RADIOLOGY REPORT*  Clinical Data:  Stroke.  Aphasia preceded by twitching of the left side of face.  MRI HEAD WITHOUT CONTRAST MRA HEAD WITHOUT CONTRAST  Technique:  Multiplanar, multiecho pulse  sequences of the brain and surrounding structures were obtained without intravenous contrast. Angiographic images of the head were obtained using MRA technique without contrast.  Comparison:  CTA head and neck 12/10/2011  MRI HEAD  Findings:  The diffusion weighted images demonstrate no evidence for acute or subacute infarction.  There is no evidence for significant to remote ischemia.  No hemorrhage or mass lesion is present.  The ventricles are of normal size.  No significant extra-axial fluid collection is present.  Flow is present in the major intracranial arteries.  The globes and orbits are intact.  The paranasal sinuses and mastoid air cells are clear.  IMPRESSION: Negative MRI of the brain.  MRA HEAD  Findings: The study is mildly degraded by patient motion.  The internal carotid arteries are within normal limits bilaterally. 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  Both ACA vessels fill from the left via a patent anterior communicating artery.  The MCA bifurcations are normal.  The ACA and MCA branch vessels are within normal limits.  The left vertebral artery is the dominant vessel.  The basilar artery is normal.  Both posterior cerebral arteries originate from basilar tip.  The PCA branch vessels are normal.  IMPRESSION: Normal variant MRA circle of Willis without evidence for significant proximal stenosis, aneurysm, or branch vessel occlusion.  Original Report Authenticated By: Jamesetta Orleans. MATTERN, M.D.    Medications:     Scheduled:   . aspirin  81 mg Oral Daily  . pantoprazole  40 mg Oral Q1200  . predniSONE  50 mg Oral Q breakfast  . DISCONTD: pantoprazole (PROTONIX) IV  40 mg Intravenous QHS    Assessment/Plan:    Patient Active Hospital Problem List: Aphasia (12/10/2011)   Assessment: cleared   Plan: stroke work up  TIA (transient ischemic attack) (12/10/2011)   Assessment: continues to have right sided facial asymmetry and inability to close her left eye.    Plan:  1. HgbA1c,  fasting lipid panel  (LDL 80, Hba1c  5.6) 2. MRI, MRA of the brain without contrast  (negative) 3. PT consult, OT consult, Speech consult  4. Echocardiogram (WNL) 5. Carotid dopplers (carotids)  Awaiting psych consult and EEG   Kathryn Morn PA-C [facility] [phone]  12/12/2011, 9:00 AM

## 2011-12-12 NOTE — Progress Notes (Signed)
Utilization review completed. Kathryn Harding 12/12/2011 

## 2011-12-12 NOTE — Progress Notes (Signed)
Discharge instructions given. Copies of labs and diagnostic test supplied to pt for the [employer] records. Transported to family car via wheelchair

## 2011-12-12 NOTE — Discharge Summary (Signed)
Physician Discharge Summary  Patient ID: Kathryn Harding MRN: [id number] DOB/AGE: Apr 16, 1981 30 y.o.  Admit date: 12/09/2011 Discharge date: 12/12/2011  Primary Care Physician:  No primary provider on file.   Discharge Diagnoses:   1. 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              Disposition and Follow-up:  Psychiatry in 7-10 days  Consults: Dr.Bezov with neurology    Significant Diagnostic Studies:  Ct Angio Head W/cm &/or Wo Cm 12/10/2011    IMPRESSION: Negative CTA of the head.  Original Report Authenticated By: Jamesetta Orleans. MATTERN, M.D.   Ct Head Wo Contrast 12/09/2011.  IMPRESSION: Normal exam.  Original Report Authenticated By: Gwynn Burly, M.D.   Ct Angio Neck W/cm &/or Wo/cm 12/10/2011  IMPRESSION: Negative CTA of the head.  Original Report Authenticated By: Jamesetta Orleans. MATTERN, M.D.   Mr Brain Wo Contrast 12/10/2011   IMPRESSION: Normal variant MRA circle of Willis without evidence for significant proximal stenosis, aneurysm, or branch vessel occlusion.  Original Report Authenticated By: Jamesetta Orleans. MATTERN, M.D.   Mr Kathryn Harding Head/brain Wo Cm 12/10/2011 IMPRESSION: Normal variant MRA circle of Willis without evidence for significant proximal stenosis, aneurysm, or branch vessel occlusion.  Original Report Authenticated By: Jamesetta Orleans. MATTERN, M.D.    Brief H and P: [name] is a 31 year old African American female who is in active duty at [employer], presented to the ER on 12/09/2011 with numbness of left side of her face associated with dysarthria and deviation of the angle of the mouth.   Hospital Course:  Admitted to the hospital with numbness of left side of the face, ptosis of the left eyelid with associated dysarthria, flattening and loss of nasolabial folds on the right. She was seen and evaluated by neurology on admission as well  as through her hospital stay. She was noted to have a left facial droop is associated with loss of nasal labial fold on the right side and hence felt to have an inconsistent exam. Initial concern was for Bell's palsy versus CVA or possible underlying demyelinating disorder She underwent an MRI and MRA of the brain which were both normal. Also felt by neurology to have a possible staring spells and hence he underwent an EEG to rule out seizures. EEG was negative. At this point given that she has inconsistent exam and negative imaging studies she was a felt to by neurology that he possibly could have a conversion disorder and hence advised to followup with psychiatry as outpatient. She also had an echocardiogram and carotid duplex which were unremarkable.  Time spent on Discharge:  Signed: Hector Taft [facility] Pager: [phone] 12/12/2011, 3:33 PM

## 2011-12-12 NOTE — Consult Note (Addendum)
EEG was normal. Clear to go from neurological perspective. Suggest outpatient psychiatric appointment for stress management. Call with questions.   Carmell Austria, MD

## 2011-12-13 NOTE — Procedures (Signed)
EEG ID:  [id number]  HISTORY:  This is a 31 years old woman referred for rule out seizures.  MEDICATIONS:  No anticonvulsant medications.  CONDITION OF RECORDING:  This 16-lead EEG was recorded with the patient in awake, drowsy, and sleep states.  Background rhythms: background patters in wakefulness were well organized with a well sustained posterior rhythm of 10 Hz, symmetrical and reactive to eye opening and closing.  Drowsiness was associated with mild attenuation of voltage and slowing frequencies.  Normal sleep patterns were seen.  Abnormal Potentials: no epileptiform activity or focal slowing was noted.  ACTIVATION PROCEDURES:  Hyperventilation was not performed.  Photic stimulation was not performed.   EKG: Single channel of EKG monitoring was uninterruptible.  IMPRESSION:  This is a normal awake, drowsy, and sleep EEG.  A normal EEG does not rule out the clinical diagnosis of epilepsy. If clinically warranted, a repeat extended EEG or ambulatory requiring may be obtained for prolonged recording times, which may increase the diagnostic yield. Clinical correlation is suggested.          ______________________________ Carmell Austria, MD    JX:[id number] D:  12/12/2011 14:26:27  T:  12/12/2011 22:00:27  Job #:  [id number]

## 2013-03-30 IMAGING — CT CT HEAD W/O CM
2 series · 16 of 30 positions shown, 20 images · non-contrast
Comparison: None.

CLINICAL DATA: Aphasia.  Left sided facial weakness.  Headache.

CT HEAD WITHOUT CONTRAST
TECHNIQUE: Contiguous axial images were obtained from the base of
the skull through the vertex without contrast.

[Series 2: head w/o · axial · non-contrast · 0.49mm/px · z∈[+87,+217]mm · 13 of 32 slices shown, 17 images]
[im 3/32  brain]
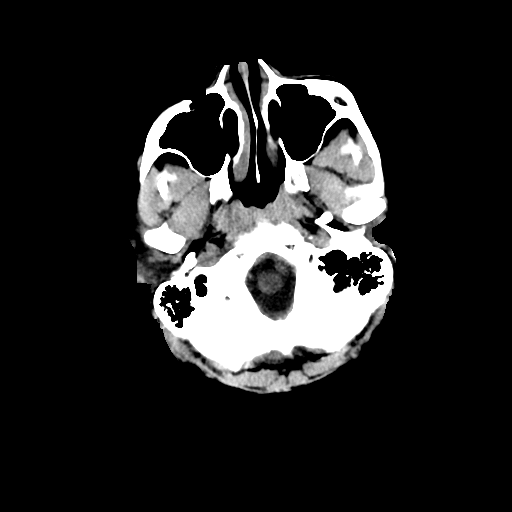
[im 3/32  bone]
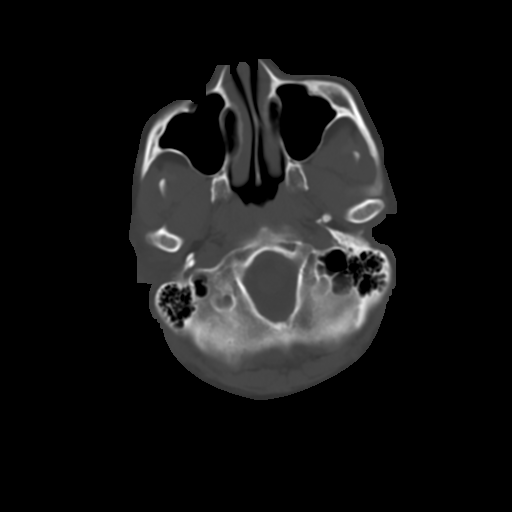
[im 5/32  brain]
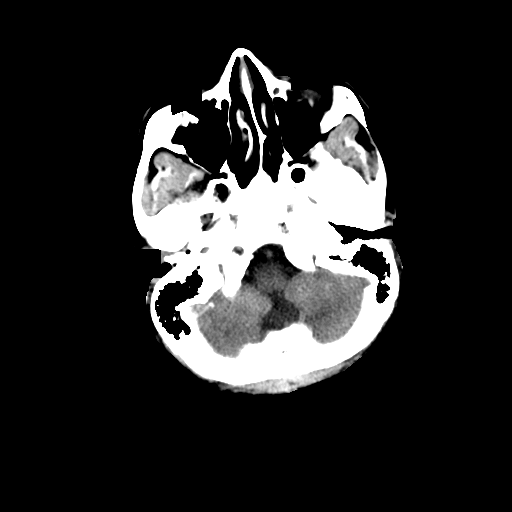
[im 7/32  brain]
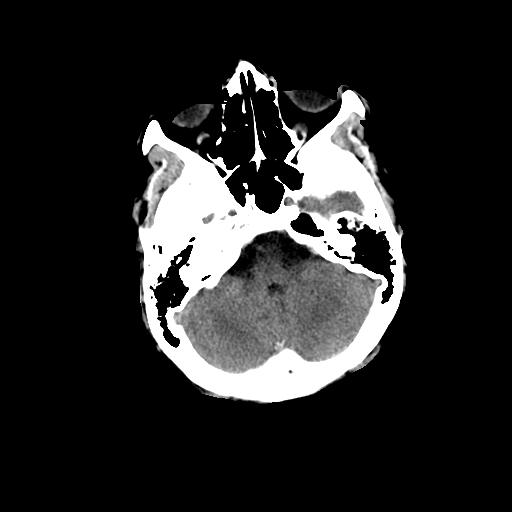
[im 9/32  brain]
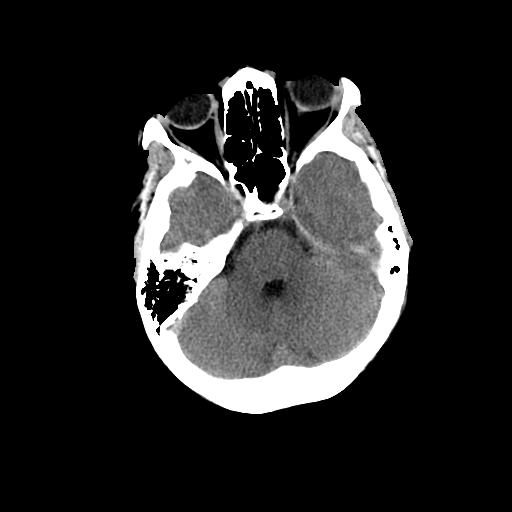
[im 12/32  brain]
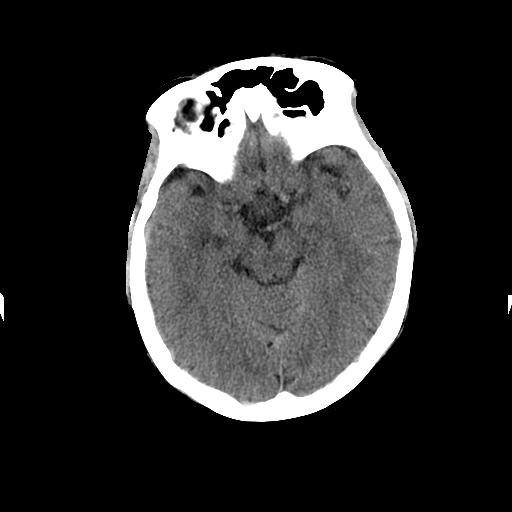
[im 12/32  bone]
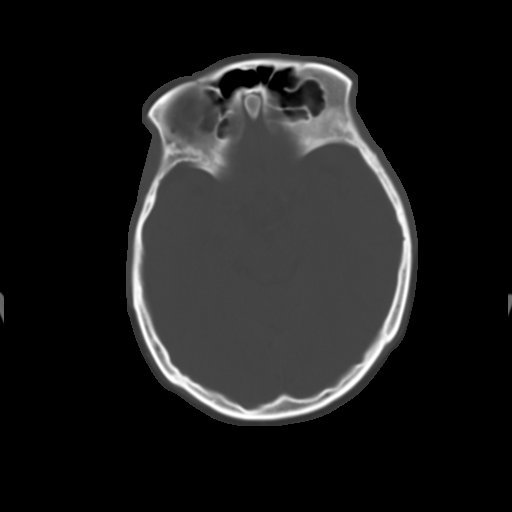
[im 14/32  brain]
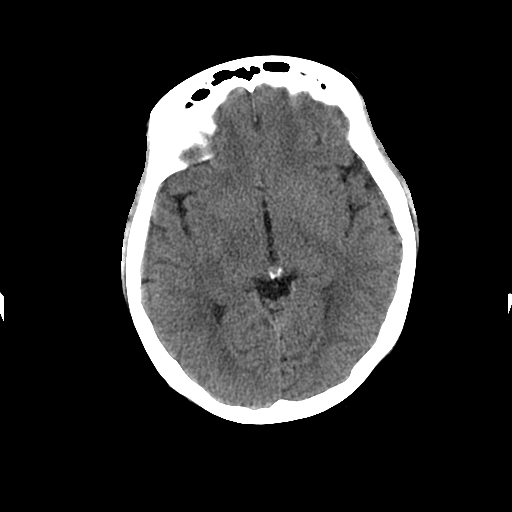
[im 16/32  brain]
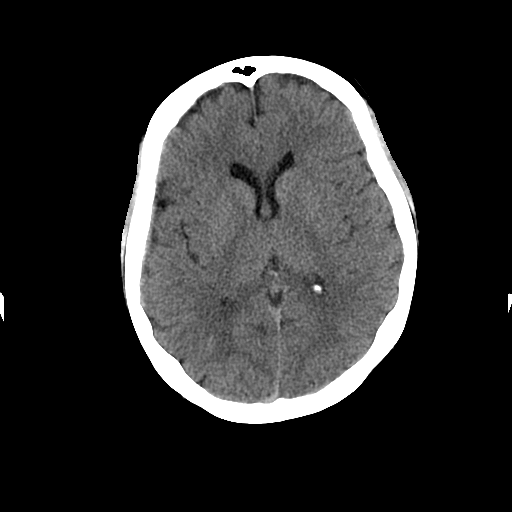
[im 18/32  brain]
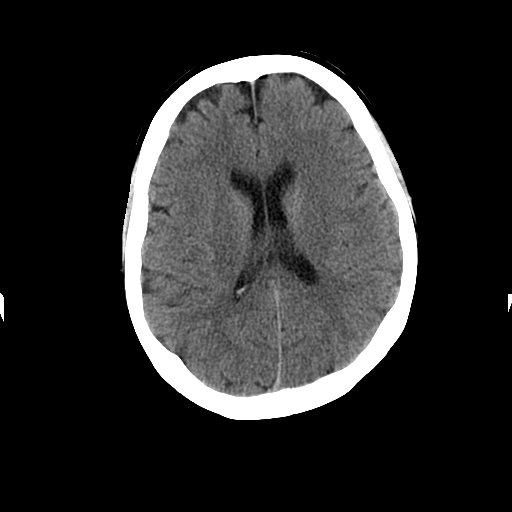
[im 20/32  brain]
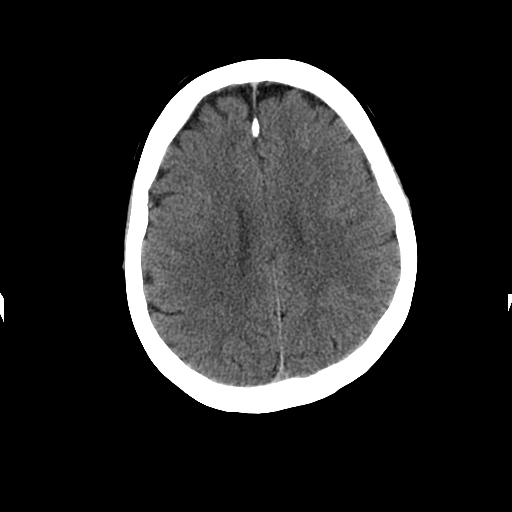
[im 20/32  bone]
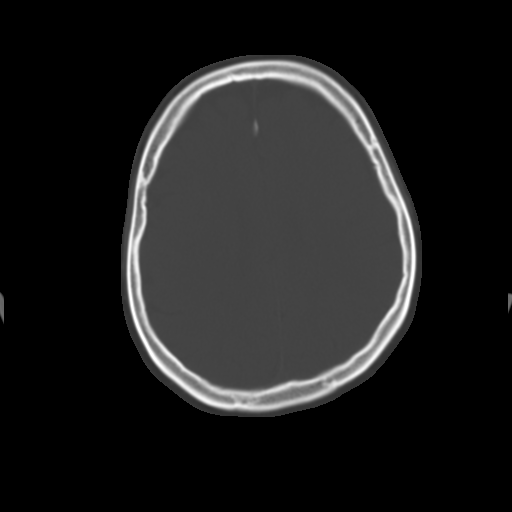
[im 23/32  brain]
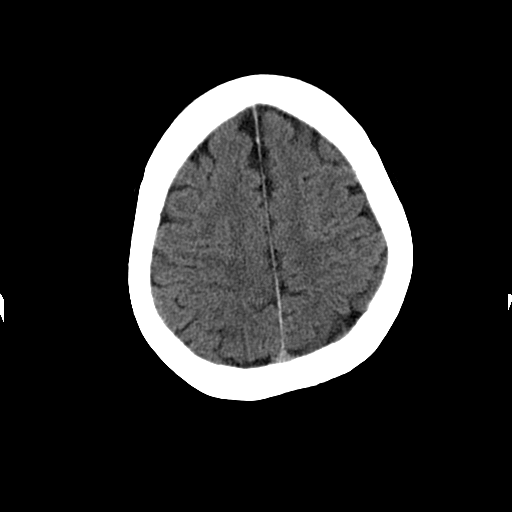
[im 25/32  brain]
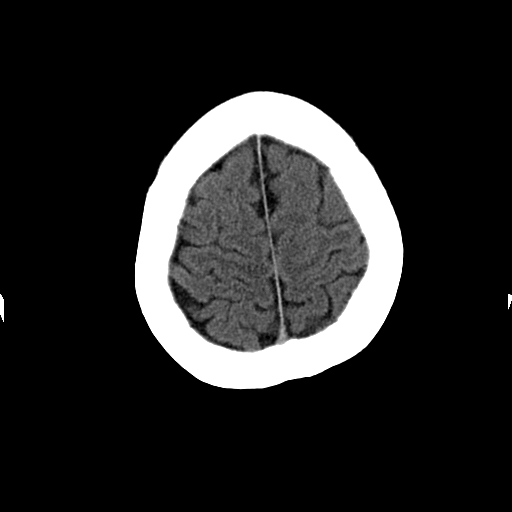
[im 27/32  brain]
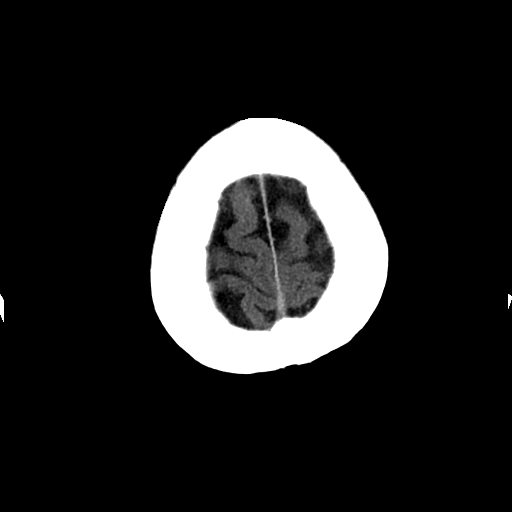
[im 29/32  brain]
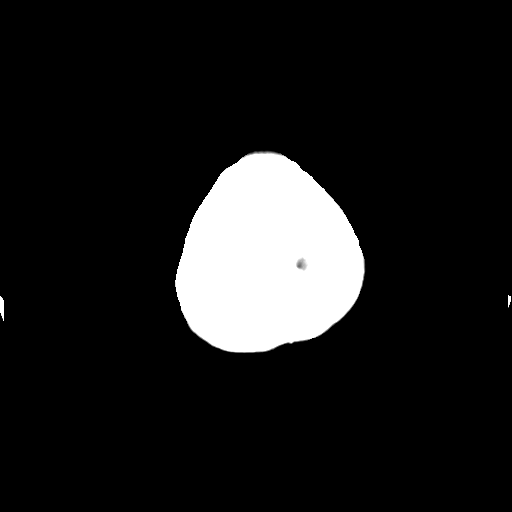
[im 29/32  bone]
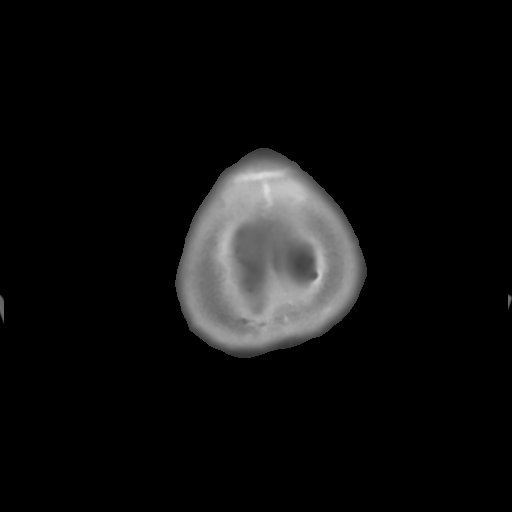

[Series 3: head w/o bone · axial · non-contrast · 0.49mm/px · z∈[+87,+132]mm · 3 of 32 slices shown]
[im 3/32  bone]
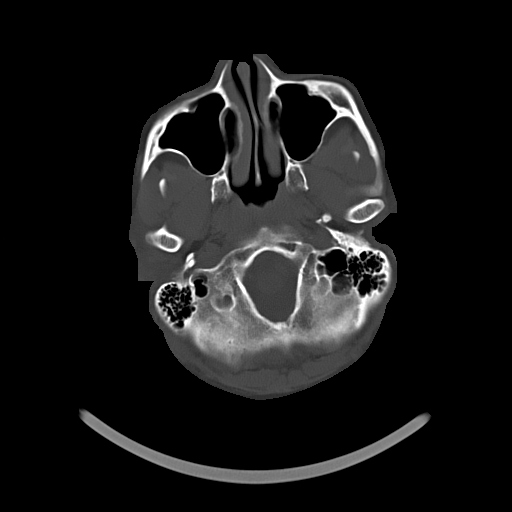
[im 7/32  bone]
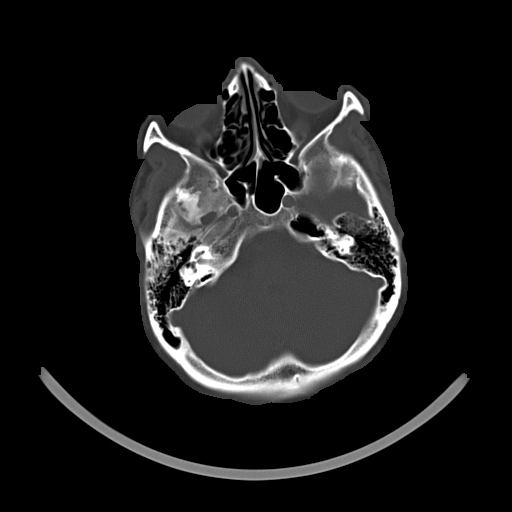
[im 12/32  bone]
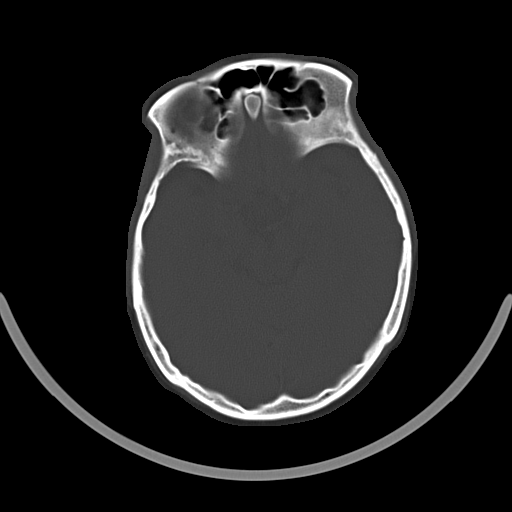

[16 of 30 positions shown; findings below may reference images not displayed]

FINDINGS: No acute intracranial hemorrhage, infarction, or mass.
Brain parenchyma is normal.  Osseous structures are normal.
IMPRESSION: Normal exam.

## 2017-09-27 ENCOUNTER — Ambulatory Visit (INDEPENDENT_AMBULATORY_CARE_PROVIDER_SITE_OTHER): Payer: 59 | Admitting: Surgery

## 2017-09-27 ENCOUNTER — Encounter: Payer: Self-pay | Admitting: Surgery

## 2017-09-27 VITALS — BP 120/81 | HR 107 | Temp 97.4°F | Ht 66.0 in | Wt 234.0 lb

## 2017-09-27 DIAGNOSIS — K8020 Calculus of gallbladder without cholecystitis without obstruction: Secondary | ICD-10-CM

## 2017-09-27 NOTE — Progress Notes (Signed)
09/27/2017  Reason for Visit:  Symptomatic cholelithiasis  History of Present Illness: Kathryn Harding is a 36 y.o. female who presents as a referral from ED in [place] for symptomatic cholelithiasis.  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  Turns out the patient has had multiple episodes through the years of very minor symptoms of right upper quadrant pain which she did not what to attribute them to until she went to the emergency room and was told about her gallstones.  In the emergency room in [place], she had an ultrasound and labs.  Labs were overall normal with a white blood cell count of 10.7 and ultrasound however which had evidence of chronic cholecystitis but no acute cholecystitis.  She was discharged from the emergency room and told to seek surgical evaluation in [place].  Since her episode of pain on 11/9, she reports that she has not had any severe pains but she continues to have mild symptoms particularly after eating.  She has been taking meloxicam and Tylenol 3 for pain.  She reports her pain is associated with nausea but no emesis.  Denies any fevers or chills at home.  Denies other areas of abdominal pain.  Denies any jaundice.  Past Medical History: Past Medical History:  Diagnosis Date  . Aphasia 12/10/2011  . Bell's palsy   . CVA (cerebral infarction)   . TIA (transient ischemic attack) 12/10/2011     Past Surgical History: Past Surgical History:  Procedure Laterality Date  . KNEE SURGERY    . OVARIAN CYST SURGERY      Home Medications: Prior to Admission medications   Medication Sig Start Date End Date Taking? Authorizing Provider  acetaminophen-codeine (TYLENOL #3) 300-30 MG tablet Take by mouth. 09/15/17  Yes [provider]  meloxicam (MOBIC) 15 MG tablet Take by mouth. 09/15/17   Yes [provider]    Allergies: Allergies  Allergen Reactions  . Sulfur Other (See Comments)    unknown    Social History:  reports that  has never smoked. she has never used smokeless tobacco. She reports that she drinks about 2.4 oz of alcohol per week. She reports that she does not use drugs.   Family History: Family History  Problem Relation Age of Onset  . Hypertension Mother   . Healthy Father   . Liver cancer Paternal Uncle   . Liver cancer Paternal Uncle     Review of Systems: Review of Systems  Constitutional: Negative for chills and fever.  HENT: Negative for hearing loss.   Eyes: Negative for blurred vision.  Respiratory: Negative for shortness of breath.   Cardiovascular: Negative for chest pain.  Gastrointestinal: Positive for abdominal pain and nausea. Negative for blood in stool, constipation, diarrhea and vomiting.  Genitourinary: Negative for dysuria.  Musculoskeletal: Negative for myalgias.  Skin: Negative for rash.  Neurological: Negative for dizziness.  Psychiatric/Behavioral: Negative for depression.  All other systems reviewed and are negative.   Physical Exam BP 120/81   Pulse (!) 107   Temp (!) 97.4 F (36.3 C) (Oral)   Ht 5\' 6"  (1.676 m)   Wt 106.1 kg (234 lb)   BMI 37.77 kg/m  CONSTITUTIONAL: No acute distress HEENT:  Normocephalic, atraumatic, extraocular motion intact. NECK: Trachea is midline, and there is no jugular venous  distension.  RESPIRATORY:  Lungs are clear, and breath sounds are equal bilaterally. Normal respiratory effort without pathologic use of accessory muscles. CARDIOVASCULAR: Heart is regular without murmurs, gallops, or rubs. GI: The abdomen is soft, obese, nondistended, with mild tenderness to palpation in the right upper quadrant.  Negative Murphy's sign at this point. MUSCULOSKELETAL:  Normal muscle strength and tone in all four extremities.  No peripheral edema or cyanosis. SKIN: Skin turgor is normal.  There are no pathologic skin lesions.  NEUROLOGIC:  Motor and sensation is grossly normal.  Cranial nerves are grossly intact. PSYCH:  Alert and oriented to person, place and time. Affect is normal.  Laboratory Analysis: From outside hospital, WBC 10.7, hemoglobin 13.1, platelet count 282, sodium 139, potassium 4, chloride 102, CO2 22 BUN 10, creatinine 0.6, glucose 85.  No LFTs were obtained.  Imaging: Ultrasound shows mild gallbladder hydrops and cholelithiasis.  There is evidence of subacute or chronic cholecystitis with mild gallbladder wall thickening.  However there is no ultrasound evidence of acute cholecystitis or bile duct obstruction.  Assessment and Plan: This is a 36 y.o. female who presents with symptomatic cholelithiasis.  Given that the studies were done in outside hospital, currently I am not able to view any images.  Discussed with the patient that given the findings and her persistent mild symptoms, it would be reasonable to pursue cholecystectomy rather than conservative management.  Discussed with the patient the plan for laparoscopic cholecystectomy and also discussed with the patient the risk of infection, bleeding, injury to surrounding structures as well as the risk for possible conversion to open procedure.  She understands these risks and all her questions have been answered and she is willing to proceed.  Discussed with her as well postop expectations, recovery time, and restrictions.  Currently we will tentatively schedule her for December 5.  She needs to check at work to make sure that she can have the time off.  Otherwise we may be able to do her surgery later in December or if not in January.  The patient will call after checking at work to confirm the date with [place].  In the meantime have instructed the patient to stick to a low-fat diet in order to decrease the risk of any further episodes.  Patient understands this plan and all of her questions have been  answered.  Face-to-face time spent with the patient and care providers was 45 minutes, with more than 50% of the time spent counseling, educating, and coordinating care of the patient.     Melvyn Neth, [place]

## 2017-09-27 NOTE — Patient Instructions (Addendum)
You have requested to have your Gallbladder removed. We will arrange this to be done on 10/11/2017 at [place] will be off from work for approximately 1-2 weeks depending on your recovery.   Please avoid greasy and fried foods if at all possible prior to your scheduled surgery to decrease symptoms until then.  Please see the [facility]) pre-care form you have been given today.  If you have any questions or concerns please call our office.   The other potential surgery dates are the following: Week of 12/17-21/2018 11/15/2017 Week of 1/21-25/2019   Laparoscopic Cholecystectomy, Care After This sheet gives you information about how to care for yourself after your procedure. Your doctor may also give you more specific instructions. If you have problems or questions, contact your doctor. Follow these instructions at home: Care for cuts from surgery (incisions)   Follow instructions from your doctor about how to take care of your cuts from surgery. Make sure you: ? Wash your hands with soap and water before you change your bandage (dressing). If you cannot use soap and water, use hand sanitizer. ? Change your bandage as told by your doctor. ? Leave stitches (sutures), skin glue, or skin tape (adhesive) strips in place. They may need to stay in place for 2 weeks or longer. If tape strips get loose and curl up, you may trim the loose edges. Do not remove tape strips completely unless your doctor says it is okay.  Do not take baths, swim, or use a hot tub until your doctor says it is okay. Ask your doctor if you can take showers. You may only be allowed to take sponge baths for bathing.  Check your surgical cut area every day for signs of infection. Check for: ? More redness, swelling, or pain. ? More fluid or blood. ? Warmth. ? Pus or a bad smell. Activity  Do not drive or use heavy machinery while taking prescription pain medicine.  Do not lift anything that is heavier than 10  lb (4.5 kg) until your doctor says it is okay.  Do not play contact sports until your doctor says it is okay.  Do not drive for 24 hours if you were given a medicine to help you relax (sedative).  Rest as needed. Do not return to work or school until your doctor says it is okay. General instructions  Take over-the-counter and prescription medicines only as told by your doctor.  To prevent or treat constipation while you are taking prescription pain medicine, your doctor may recommend that you: ? Drink enough fluid to keep your pee (urine) clear or pale yellow. ? Take over-the-counter or prescription medicines. ? Eat foods that are high in fiber, such as fresh fruits and vegetables, whole grains, and beans. ? Limit foods that are high in fat and processed sugars, such as fried and sweet foods. Contact a doctor if:  You develop a rash.  You have more redness, swelling, or pain around your surgical cuts.  You have more fluid or blood coming from your surgical cuts.  Your surgical cuts feel warm to the touch.  You have pus or a bad smell coming from your surgical cuts.  You have a fever.  One or more of your surgical cuts breaks open. Get help right away if:  You have trouble breathing.  You have chest pain.  You have pain that is getting worse in your shoulders.  You faint or feel dizzy when you stand.  You have very bad pain in your belly (abdomen).  You are sick to your stomach (nauseous) for more than one day.  You have throwing up (vomiting) that lasts for more than one day.  You have leg pain. This information is not intended to replace advice given to you by your health care provider. Make sure you discuss any questions you have with your health care provider. Document Released: 08/02/2008 Document Revised: 05/14/2016 Document Reviewed: 04/11/2016 Elsevier Interactive Patient Education  2017 [employer].

## 2017-09-27 NOTE — H&P (View-Only) (Signed)
09/27/2017  Reason for Visit:  Symptomatic cholelithiasis  History of Present Illness: Kathryn Harding is a 36 y.o. female who presents as a referral from ED in [place] for symptomatic cholelithiasis.  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  Turns out the patient has had multiple episodes through the years of very minor symptoms of right upper quadrant pain which she did not what to attribute them to until she went to the emergency room and was told about her gallstones.  In the emergency room in [place], she had an ultrasound and labs.  Labs were overall normal with a white blood cell count of 10.7 and ultrasound however which had evidence of chronic cholecystitis but no acute cholecystitis.  She was discharged from the emergency room and told to seek surgical evaluation in [place].  Since her episode of pain on 11/9, she reports that she has not had any severe pains but she continues to have mild symptoms particularly after eating.  She has been taking meloxicam and Tylenol 3 for pain.  She reports her pain is associated with nausea but no emesis.  Denies any fevers or chills at home.  Denies other areas of abdominal pain.  Denies any jaundice.  Past Medical History: Past Medical History:  Diagnosis Date  . Aphasia 12/10/2011  . Bell's palsy   . CVA (cerebral infarction)   . TIA (transient ischemic attack) 12/10/2011     Past Surgical History: Past Surgical History:  Procedure Laterality Date  . KNEE SURGERY    . OVARIAN CYST SURGERY      Home Medications: Prior to Admission medications   Medication Sig Start Date End Date Taking? Authorizing Provider  acetaminophen-codeine (TYLENOL #3) 300-30 MG tablet Take by mouth. 09/15/17  Yes [provider]  meloxicam (MOBIC) 15 MG tablet Take by mouth. 09/15/17   Yes [provider]    Allergies: Allergies  Allergen Reactions  . Sulfur Other (See Comments)    unknown    Social History:  reports that  has never smoked. she has never used smokeless tobacco. She reports that she drinks about 2.4 oz of alcohol per week. She reports that she does not use drugs.   Family History: Family History  Problem Relation Age of Onset  . Hypertension Mother   . Healthy Father   . Liver cancer Paternal Uncle   . Liver cancer Paternal Uncle     Review of Systems: Review of Systems  Constitutional: Negative for chills and fever.  HENT: Negative for hearing loss.   Eyes: Negative for blurred vision.  Respiratory: Negative for shortness of breath.   Cardiovascular: Negative for chest pain.  Gastrointestinal: Positive for abdominal pain and nausea. Negative for blood in stool, constipation, diarrhea and vomiting.  Genitourinary: Negative for dysuria.  Musculoskeletal: Negative for myalgias.  Skin: Negative for rash.  Neurological: Negative for dizziness.  Psychiatric/Behavioral: Negative for depression.  All other systems reviewed and are negative.   Physical Exam BP 120/81   Pulse (!) 107   Temp (!) 97.4 F (36.3 C) (Oral)   Ht 5\' 6"  (1.676 m)   Wt 106.1 kg (234 lb)   BMI 37.77 kg/m  CONSTITUTIONAL: No acute distress HEENT:  Normocephalic, atraumatic, extraocular motion intact. NECK: Trachea is midline, and there is no jugular venous  distension.  RESPIRATORY:  Lungs are clear, and breath sounds are equal bilaterally. Normal respiratory effort without pathologic use of accessory muscles. CARDIOVASCULAR: Heart is regular without murmurs, gallops, or rubs. GI: The abdomen is soft, obese, nondistended, with mild tenderness to palpation in the right upper quadrant.  Negative Murphy's sign at this point. MUSCULOSKELETAL:  Normal muscle strength and tone in all four extremities.  No peripheral edema or cyanosis. SKIN: Skin turgor is normal.  There are no pathologic skin lesions.  NEUROLOGIC:  Motor and sensation is grossly normal.  Cranial nerves are grossly intact. PSYCH:  Alert and oriented to person, place and time. Affect is normal.  Laboratory Analysis: From outside hospital, WBC 10.7, hemoglobin 13.1, platelet count 282, sodium 139, potassium 4, chloride 102, CO2 22 BUN 10, creatinine 0.6, glucose 85.  No LFTs were obtained.  Imaging: Ultrasound shows mild gallbladder hydrops and cholelithiasis.  There is evidence of subacute or chronic cholecystitis with mild gallbladder wall thickening.  However there is no ultrasound evidence of acute cholecystitis or bile duct obstruction.  Assessment and Plan: This is a 36 y.o. female who presents with symptomatic cholelithiasis.  Given that the studies were done in outside hospital, currently I am not able to view any images.  Discussed with the patient that given the findings and her persistent mild symptoms, it would be reasonable to pursue cholecystectomy rather than conservative management.  Discussed with the patient the plan for laparoscopic cholecystectomy and also discussed with the patient the risk of infection, bleeding, injury to surrounding structures as well as the risk for possible conversion to open procedure.  She understands these risks and all her questions have been answered and she is willing to proceed.  Discussed with her as well postop expectations, recovery time, and restrictions.  Currently we will tentatively schedule her for December 5.  She needs to check at work to make sure that she can have the time off.  Otherwise we may be able to do her surgery later in December or if not in January.  The patient will call after checking at work to confirm the date with [place].  In the meantime have instructed the patient to stick to a low-fat diet in order to decrease the risk of any further episodes.  Patient understands this plan and all of her questions have been  answered.  Face-to-face time spent with the patient and care providers was 45 minutes, with more than 50% of the time spent counseling, educating, and coordinating care of the patient.     Melvyn Neth, [place]

## 2017-10-02 ENCOUNTER — Telehealth: Payer: Self-pay | Admitting: Surgery

## 2017-10-02 NOTE — Telephone Encounter (Signed)
Pt advised of pre op date/time and sx date. Sx: 10/11/17 with Dr Piscoya--laparoscopic cholecystectomy.  Pre op: 10/04/17 @ 9:45am--office interview.   Patient made aware to call [phone], between 1-3:00pm the day before surgery, to find out what time to arrive.

## 2017-10-03 ENCOUNTER — Other Ambulatory Visit: Payer: Self-pay

## 2017-10-03 ENCOUNTER — Encounter
Admission: RE | Admit: 2017-10-03 | Discharge: 2017-10-03 | Disposition: A | Discharge status: Home / Self Care | Payer: 59 | Source: Ambulatory Visit | Attending: Surgery | Admitting: Surgery

## 2017-10-03 DIAGNOSIS — Z01818 Encounter for other preprocedural examination: Secondary | ICD-10-CM | POA: Insufficient documentation

## 2017-10-03 HISTORY — DX: Disease of gallbladder, unspecified: K82.9

## 2017-10-03 LAB — COMPREHENSIVE METABOLIC PANEL
ALBUMIN: 4 g/dL (ref 3.5–5.0)
ALK PHOS: 77 U/L (ref 38–126)
ALT: 11 U/L — AB (ref 14–54)
AST: 21 U/L (ref 15–41)
Anion gap: 10 (ref 5–15)
BUN: 19 mg/dL (ref 6–20)
CALCIUM: 9.2 mg/dL (ref 8.9–10.3)
CHLORIDE: 106 mmol/L (ref 101–111)
CO2: 23 mmol/L (ref 22–32)
CREATININE: 0.67 mg/dL (ref 0.44–1.00)
GFR calc Af Amer: >60 mL/min (ref >60)
GFR calc non Af Amer: >60 mL/min (ref >60)
GLUCOSE: 89 mg/dL (ref 65–99)
Potassium: 3.7 mmol/L (ref 3.5–5.1)
SODIUM: 139 mmol/L (ref 135–145)
Total Bilirubin: 0.4 mg/dL (ref 0.3–1.2)
Total Protein: 7.5 g/dL (ref 6.5–8.1)

## 2017-10-03 LAB — CBC WITH DIFFERENTIAL/PLATELET
BASOS ABS: 0.1 10*3/uL (ref 0–0.1)
BASOS PCT: 1 %
EOS ABS: 0.1 10*3/uL (ref 0–0.7)
Eosinophils Relative: 2 %
HCT: 38.5 % (ref 35.0–47.0)
HEMOGLOBIN: 12.7 g/dL (ref 12.0–16.0)
LYMPHS ABS: 2 10*3/uL (ref 1.0–3.6)
Lymphocytes Relative: 23 %
MCH: 29.1 pg (ref 26.0–34.0)
MCHC: 33 g/dL (ref 32.0–36.0)
MCV: 88.3 fL (ref 80.0–100.0)
Monocytes Absolute: 0.7 10*3/uL (ref 0.2–0.9)
Monocytes Relative: 8 %
Neutro Abs: 5.9 10*3/uL (ref 1.4–6.5)
Neutrophils Relative %: 66 %
Platelets: 277 10*3/uL (ref 150–440)
RBC: 4.35 MIL/uL (ref 3.80–5.20)
RDW: 13.4 % (ref 11.5–14.5)
WBC: 8.8 10*3/uL (ref 3.6–11.0)

## 2017-10-03 LAB — PROTIME-INR
INR: 1.05
PROTHROMBIN TIME: 13.6 s (ref 11.4–15.2)

## 2017-10-03 MED ORDER — CHLORHEXIDINE GLUCONATE CLOTH 2 % EX PADS
6.0000 | MEDICATED_PAD | Freq: Once | CUTANEOUS | Status: DC
  Filled 2017-10-03: qty 6

## 2017-10-03 NOTE — Patient Instructions (Signed)
Your procedure is scheduled on: 10/11/17 Wed Report to Same Day Surgery 2nd floor medical mall [facility] Entrance-take elevator on left to 2nd floor.  Check in with surgery information desk.) To find out your arrival time please call [phone] between 1PM - 3PM on 10/10/17 Tues  Remember: Instructions that are not followed completely may result in serious medical risk, up to and including death, or upon the discretion of your surgeon and anesthesiologist your surgery may need to be rescheduled.    _x___ 1. Do not eat food after midnight the night before your procedure. You may drink clear liquids up to 2 hours before you are scheduled to arrive at the hospital for your procedure.  Do not drink clear liquids within 2 hours of your scheduled arrival to the hospital.  Clear liquids include  --Water or Apple juice without pulp  --Clear carbohydrate beverage such as ClearFast or Gatorade  --Black Coffee or Clear Tea (No milk, no creamers, do not add anything to                  the coffee or Tea Type 1 and type 2 diabetics should only drink water.  No gum chewing or hard candies.     __x__ 2. No Alcohol for 24 hours before or after surgery.   __x__3. No Smoking for 24 prior to surgery.   ____  4. Bring all medications with you on the day of surgery if instructed.    __x__ 5. Notify your doctor if there is any change in your medical condition     (cold, fever, infections).     Do not wear jewelry, make-up, hairpins, clips or nail polish.  Do not wear lotions, powders, or perfumes. You may wear deodorant.  Do not shave 48 hours prior to surgery. Men may shave face and neck.  Do not bring valuables to the hospital.    [facility] is not responsible for any belongings or valuables.               Contacts, dentures or bridgework may not be worn into surgery.  Leave your suitcase in the car. After surgery it may be brought to your room.  For patients admitted to the hospital, discharge  time is determined by your                       treatment team.   Patients discharged the day of surgery will not be allowed to drive home.  You will need someone to drive you home and stay with you the night of your procedure.    Please read over the following fact sheets that you were given:   [facility] Preparing for Surgery and or MRSA Information   _x___ Take anti-hypertensive listed below, cardiac, seizure, asthma,     anti-reflux and psychiatric medicines. These include:  1.   2.  3.  4.  5.  6.  ____Fleets enema or Magnesium Citrate as directed.   _x___ Use CHG Soap or sage wipes as directed on instruction sheet   ____ Use inhalers on the day of surgery and bring to hospital day of surgery  ____ Stop Metformin and Janumet 2 days prior to surgery.    ____ Take 1/2 of usual insulin dose the night before surgery and none on the morning     surgery.   _x___ Follow recommendations from Cardiologist, Pulmonologist or PCP regarding  stopping Aspirin, Coumadin, Plavix ,Eliquis, Effient, or Pradaxa, and Pletal.  X____Stop Anti-inflammatories such as Advil, Aleve, Ibuprofen, Motrin, Naproxen, Naprosyn, Goodies powders or aspirin products. OK to take Tylenol and                          Celebrex.  Stop Meloxicam 1 week before surgery.   _x___ Stop supplements until after surgery.  But may continue Vitamin D, Vitamin B,       and multivitamin.   ____ Bring C-Pap to the hospital.

## 2017-10-04 ENCOUNTER — Inpatient Hospital Stay: Admission: RE | Admit: 2017-10-04 | Source: Ambulatory Visit

## 2017-10-10 ENCOUNTER — Telehealth: Payer: Self-pay | Admitting: Surgery

## 2017-10-10 MED ORDER — CEFAZOLIN SODIUM-DEXTROSE 2-4 GM/100ML-% IV SOLN
2.0000 g | INTRAVENOUS | Status: AC
  Administered 2017-10-11: 2 g via INTRAVENOUS

## 2017-10-10 NOTE — Telephone Encounter (Signed)
A representative  has called from [employer] in reference to patient's FMLA. Date of surgery, last office date and clinical information was verbally given. She states that the [employer] paperwork does not need to be filled out due to the represenative having all documentation that is needed.

## 2017-10-11 ENCOUNTER — Ambulatory Visit: Payer: 59 | Admitting: Certified Registered Nurse Anesthetist

## 2017-10-11 ENCOUNTER — Encounter
Admission: RE | Disposition: A | Discharge status: Home / Self Care | Payer: Self-pay | Source: Ambulatory Visit | Attending: Surgery

## 2017-10-11 ENCOUNTER — Encounter: Payer: Self-pay | Admitting: Emergency Medicine

## 2017-10-11 ENCOUNTER — Ambulatory Visit
Admission: RE | Admit: 2017-10-11 | Discharge: 2017-10-11 | Disposition: A | Discharge status: Home / Self Care | Payer: 59 | Source: Ambulatory Visit | Attending: Surgery | Admitting: Surgery

## 2017-10-11 DIAGNOSIS — Z79899 Other long term (current) drug therapy: Secondary | ICD-10-CM | POA: Diagnosis not present

## 2017-10-11 DIAGNOSIS — K8020 Calculus of gallbladder without cholecystitis without obstruction: Secondary | ICD-10-CM

## 2017-10-11 DIAGNOSIS — Z882 Allergy status to sulfonamides status: Secondary | ICD-10-CM | POA: Diagnosis not present

## 2017-10-11 DIAGNOSIS — Z8673 Personal history of transient ischemic attack (TIA), and cerebral infarction without residual deficits: Secondary | ICD-10-CM | POA: Insufficient documentation

## 2017-10-11 DIAGNOSIS — K8010 Calculus of gallbladder with chronic cholecystitis without obstruction: Secondary | ICD-10-CM | POA: Insufficient documentation

## 2017-10-11 HISTORY — PX: CHOLECYSTECTOMY: SHX55

## 2017-10-11 LAB — POCT PREGNANCY, URINE: PREG TEST UR: NEGATIVE

## 2017-10-11 SURGERY — LAPAROSCOPIC CHOLECYSTECTOMY
Anesthesia: General | Site: Abdomen | Wound class: Clean Contaminated

## 2017-10-11 MED ORDER — ROCURONIUM BROMIDE 50 MG/5ML IV SOLN
INTRAVENOUS | Status: AC
  Filled 2017-10-11: qty 1

## 2017-10-11 MED ORDER — DEXAMETHASONE SODIUM PHOSPHATE 10 MG/ML IJ SOLN
INTRAMUSCULAR | Status: AC
  Filled 2017-10-11: qty 1

## 2017-10-11 MED ORDER — FENTANYL CITRATE (PF) 100 MCG/2ML IJ SOLN: 25.0000 ug | INTRAMUSCULAR | Status: DC | PRN

## 2017-10-11 MED ORDER — ACETAMINOPHEN 500 MG PO TABS
1000.0000 mg | ORAL_TABLET | ORAL | Status: AC
Start: 2017-10-11 — End: 2017-10-11
  Administered 2017-10-11: 1000 mg via ORAL

## 2017-10-11 MED ORDER — FAMOTIDINE 20 MG PO TABS
20.0000 mg | ORAL_TABLET | Freq: Once | ORAL | Status: AC
  Administered 2017-10-11: 20 mg via ORAL

## 2017-10-11 MED ORDER — GABAPENTIN 300 MG PO CAPS
300.0000 mg | ORAL_CAPSULE | ORAL | Status: AC
  Administered 2017-10-11: 300 mg via ORAL

## 2017-10-11 MED ORDER — FENTANYL CITRATE (PF) 100 MCG/2ML IJ SOLN
INTRAMUSCULAR | Status: AC
  Filled 2017-10-11: qty 2

## 2017-10-11 MED ORDER — GABAPENTIN 300 MG PO CAPS
ORAL_CAPSULE | ORAL | Status: AC
  Administered 2017-10-11: 300 mg via ORAL
  Filled 2017-10-11: qty 1

## 2017-10-11 MED ORDER — SUCCINYLCHOLINE CHLORIDE 20 MG/ML IJ SOLN
INTRAMUSCULAR | Status: AC
  Filled 2017-10-11: qty 1

## 2017-10-11 MED ORDER — SUGAMMADEX SODIUM 500 MG/5ML IV SOLN
INTRAVENOUS | Status: AC
  Filled 2017-10-11: qty 5

## 2017-10-11 MED ORDER — LACTATED RINGERS IV SOLN
INTRAVENOUS | Status: DC
  Administered 2017-10-11: 08:00:00 via INTRAVENOUS

## 2017-10-11 MED ORDER — EPHEDRINE SULFATE 50 MG/ML IJ SOLN
INTRAMUSCULAR | Status: AC
  Filled 2017-10-11: qty 1

## 2017-10-11 MED ORDER — LIDOCAINE HCL (PF) 2 % IJ SOLN
INTRAMUSCULAR | Status: AC
  Filled 2017-10-11: qty 10

## 2017-10-11 MED ORDER — PHENYLEPHRINE HCL 10 MG/ML IJ SOLN
INTRAMUSCULAR | Status: AC
  Filled 2017-10-11: qty 1

## 2017-10-11 MED ORDER — MIDAZOLAM HCL 2 MG/2ML IJ SOLN
INTRAMUSCULAR | Status: DC | PRN
  Administered 2017-10-11: 2 mg via INTRAVENOUS

## 2017-10-11 MED ORDER — DEXAMETHASONE SODIUM PHOSPHATE 10 MG/ML IJ SOLN
INTRAMUSCULAR | Status: DC | PRN
  Administered 2017-10-11: 10 mg via INTRAVENOUS

## 2017-10-11 MED ORDER — FAMOTIDINE 20 MG PO TABS
ORAL_TABLET | ORAL | Status: AC
  Administered 2017-10-11: 20 mg via ORAL
  Filled 2017-10-11: qty 1

## 2017-10-11 MED ORDER — SUGAMMADEX SODIUM 500 MG/5ML IV SOLN
INTRAVENOUS | Status: DC | PRN
  Administered 2017-10-11: 250 mg via INTRAVENOUS

## 2017-10-11 MED ORDER — CEFAZOLIN SODIUM-DEXTROSE 2-4 GM/100ML-% IV SOLN
INTRAVENOUS | Status: AC
  Filled 2017-10-11: qty 100

## 2017-10-11 MED ORDER — ONDANSETRON HCL 4 MG/2ML IJ SOLN
INTRAMUSCULAR | Status: AC
  Filled 2017-10-11: qty 2

## 2017-10-11 MED ORDER — GLYCOPYRROLATE 0.2 MG/ML IJ SOLN
INTRAMUSCULAR | Status: DC | PRN
  Administered 2017-10-11: 0.2 mg via INTRAVENOUS

## 2017-10-11 MED ORDER — ONDANSETRON HCL 4 MG/2ML IJ SOLN: 4.0000 mg | Freq: Once | INTRAMUSCULAR | Status: DC | PRN

## 2017-10-11 MED ORDER — BUPIVACAINE-EPINEPHRINE 0.25% -1:200000 IJ SOLN
INTRAMUSCULAR | Status: DC | PRN
  Administered 2017-10-11: 30 mL

## 2017-10-11 MED ORDER — OXYCODONE HCL 5 MG PO TABS: 5.0000 mg | ORAL_TABLET | Freq: Four times a day (QID) | ORAL | 0 refills | Status: DC | PRN

## 2017-10-11 MED ORDER — LIDOCAINE HCL (CARDIAC) 20 MG/ML IV SOLN
INTRAVENOUS | Status: DC | PRN
  Administered 2017-10-11: 100 mg via INTRAVENOUS

## 2017-10-11 MED ORDER — FENTANYL CITRATE (PF) 100 MCG/2ML IJ SOLN
INTRAMUSCULAR | Status: DC | PRN
  Administered 2017-10-11 (×2): 50 ug via INTRAVENOUS
  Administered 2017-10-11: 100 ug via INTRAVENOUS

## 2017-10-11 MED ORDER — ONDANSETRON HCL 4 MG/2ML IJ SOLN
INTRAMUSCULAR | Status: DC | PRN
  Administered 2017-10-11: 4 mg via INTRAVENOUS

## 2017-10-11 MED ORDER — BUPIVACAINE-EPINEPHRINE (PF) 0.25% -1:200000 IJ SOLN
INTRAMUSCULAR | Status: AC
  Filled 2017-10-11: qty 30

## 2017-10-11 MED ORDER — ROCURONIUM BROMIDE 100 MG/10ML IV SOLN
INTRAVENOUS | Status: DC | PRN
  Administered 2017-10-11: 20 mg via INTRAVENOUS
  Administered 2017-10-11: 30 mg via INTRAVENOUS

## 2017-10-11 MED ORDER — IBUPROFEN 600 MG PO TABS: 600.0000 mg | ORAL_TABLET | Freq: Three times a day (TID) | ORAL | 0 refills | Status: DC | PRN

## 2017-10-11 MED ORDER — ACETAMINOPHEN 500 MG PO TABS
ORAL_TABLET | ORAL | Status: AC
  Administered 2017-10-11: 1000 mg via ORAL
  Filled 2017-10-11: qty 2

## 2017-10-11 MED ORDER — OXYCODONE HCL 5 MG PO TABS
5.0000 mg | ORAL_TABLET | Freq: Four times a day (QID) | ORAL | Status: AC | PRN
  Administered 2017-10-11: 5 mg via ORAL
  Filled 2017-10-11: qty 1

## 2017-10-11 MED ORDER — MIDAZOLAM HCL 2 MG/2ML IJ SOLN
INTRAMUSCULAR | Status: AC
  Filled 2017-10-11: qty 2

## 2017-10-11 MED ORDER — OXYCODONE HCL 5 MG PO TABS
ORAL_TABLET | ORAL | Status: AC
  Filled 2017-10-11: qty 1

## 2017-10-11 MED ORDER — PROPOFOL 10 MG/ML IV BOLUS
INTRAVENOUS | Status: DC | PRN
  Administered 2017-10-11: 200 mg via INTRAVENOUS

## 2017-10-11 MED ORDER — PROPOFOL 10 MG/ML IV BOLUS
INTRAVENOUS | Status: AC
  Filled 2017-10-11: qty 20

## 2017-10-11 SURGICAL SUPPLY — 42 items
APPLIER CLIP 5 13 M/L LIGAMAX5 (MISCELLANEOUS) ×3
BLADE SURG 15 STRL LF DISP TIS (BLADE) ×1 IMPLANT
BLADE SURG 15 STRL SS (BLADE) ×2
CANISTER SUCT 1200ML W/VALVE (MISCELLANEOUS) ×3 IMPLANT
CATH CHOLANGI 4FR 420404F (CATHETERS) IMPLANT
CHLORAPREP W/TINT 26ML (MISCELLANEOUS) ×3 IMPLANT
CLIP APPLIE 5 13 M/L LIGAMAX5 (MISCELLANEOUS) ×1 IMPLANT
CONRAY 60ML FOR OR (MISCELLANEOUS) ×3 IMPLANT
DERMABOND ADVANCED (GAUZE/BANDAGES/DRESSINGS) ×2
DERMABOND ADVANCED .7 DNX12 (GAUZE/BANDAGES/DRESSINGS) ×1 IMPLANT
DRAPE C-ARM XRAY 36X54 (DRAPES) IMPLANT
ELECT REM PT RETURN 9FT ADLT (ELECTROSURGICAL) ×3
ELECTRODE REM PT RTRN 9FT ADLT (ELECTROSURGICAL) ×1 IMPLANT
FILTER LAP SMOKE EVAC STRL (MISCELLANEOUS) ×3 IMPLANT
GLOVE SURG SYN 7.0 (GLOVE) ×3 IMPLANT
GLOVE SURG SYN 7.5  E (GLOVE) ×2
GLOVE SURG SYN 7.5 E (GLOVE) ×1 IMPLANT
GOWN STRL REUS W/ TWL LRG LVL3 (GOWN DISPOSABLE) ×3 IMPLANT
GOWN STRL REUS W/TWL LRG LVL3 (GOWN DISPOSABLE) ×6
IRRIGATION STRYKERFLOW (MISCELLANEOUS) IMPLANT
IRRIGATOR STRYKERFLOW (MISCELLANEOUS)
IV CATH ANGIO 12GX3 LT BLUE (NEEDLE) ×3 IMPLANT
IV NS 1000ML (IV SOLUTION)
IV NS 1000ML BAXH (IV SOLUTION) IMPLANT
JACKSON PRATT 10 (INSTRUMENTS) IMPLANT
L-HOOK LAP DISP 36CM (ELECTROSURGICAL) ×3
LABEL OR SOLS (LABEL) ×3 IMPLANT
LHOOK LAP DISP 36CM (ELECTROSURGICAL) ×1 IMPLANT
NEEDLE HYPO 22GX1.5 SAFETY (NEEDLE) ×6 IMPLANT
PACK LAP CHOLECYSTECTOMY (MISCELLANEOUS) ×3 IMPLANT
PENCIL ELECTRO HAND CTR (MISCELLANEOUS) ×3 IMPLANT
POUCH SPECIMEN RETRIEVAL 10MM (ENDOMECHANICALS) ×3 IMPLANT
SCISSORS METZENBAUM CVD 33 (INSTRUMENTS) ×3 IMPLANT
SLEEVE ADV FIXATION 5X100MM (TROCAR) ×9 IMPLANT
SPONGE VERSALON 4X4 4PLY (MISCELLANEOUS) IMPLANT
SUT MNCRL 4-0 (SUTURE) ×2
SUT MNCRL 4-0 27XMFL (SUTURE) ×1
SUT VICRYL 0 AB UR-6 (SUTURE) ×3 IMPLANT
SUTURE MNCRL 4-0 27XMF (SUTURE) ×1 IMPLANT
TROCAR BALLN GELPORT 12X130M (ENDOMECHANICALS) ×3 IMPLANT
TROCAR Z-THREAD OPTICAL 5X100M (TROCAR) ×3 IMPLANT
TUBING INSUFFLATION (TUBING) ×3 IMPLANT

## 2017-10-11 NOTE — Transfer of Care (Signed)
Immediate Anesthesia Transfer of Care Note  Patient: Kathryn Harding  Procedure(s) Performed: LAPAROSCOPIC CHOLECYSTECTOMY (N/A Abdomen)  Patient Location: PACU  Anesthesia Type:General  Level of Consciousness: awake  Airway & Oxygen Therapy: Patient Spontanous Breathing and Patient connected to face mask oxygen  Post-op Assessment: Report given to RN and Post -op Vital signs reviewed and stable  Post vital signs: Reviewed and stable  Last Vitals:  Vitals:   10/11/17 0811  BP: 124/79  Pulse: 68  Resp: 17  Temp: (!) 35.6 C  SpO2: 97%    Last Pain:  Vitals:   10/11/17 0811  TempSrc: Tympanic         Complications: No apparent anesthesia complications

## 2017-10-11 NOTE — Anesthesia Postprocedure Evaluation (Signed)
Anesthesia Post Note  Patient: Kathryn Harding  Procedure(s) Performed: LAPAROSCOPIC CHOLECYSTECTOMY (N/A Abdomen)  Patient location during evaluation: PACU Anesthesia Type: General Level of consciousness: awake and alert Pain management: pain level controlled Vital Signs Assessment: post-procedure vital signs reviewed and stable Respiratory status: spontaneous breathing and respiratory function stable Cardiovascular status: stable Anesthetic complications: no     Last Vitals:  Vitals:   10/11/17 1118 10/11/17 1133  BP: 110/69 113/64  Pulse: 61 (!) 59  Resp: 16 14  Temp:  36.8 C  SpO2: 92% 94%    Last Pain:  Vitals:   10/11/17 1133  TempSrc:   PainSc: 0-No pain                 KEPHART,WILLIAM K

## 2017-10-11 NOTE — Anesthesia Preprocedure Evaluation (Signed)
Anesthesia Evaluation  Patient identified by MRN, date of birth, ID band Patient awake    Reviewed: Allergy & Precautions, NPO status , Patient's Chart, lab work & pertinent test results  History of Anesthesia Complications Negative for: history of anesthetic complications  Airway Mallampati: II       Dental   Pulmonary neg sleep apnea, neg COPD,           Cardiovascular (-) hypertension(-) Past MI and (-) CHF (-) dysrhythmias (-) Valvular Problems/Murmurs     Neuro/Psych  Neuromuscular disease (Bells Palsy, L lip droop, visual problems)    GI/Hepatic Neg liver ROS, neg GERD  ,  Endo/Other  neg diabetes  Renal/GU negative Renal ROS     Musculoskeletal   Abdominal   Peds  Hematology   Anesthesia Other Findings   Reproductive/Obstetrics                             Anesthesia Physical Anesthesia Plan  ASA: II  Anesthesia Plan: General   Post-op Pain Management:    Induction: Intravenous  PONV Risk Score and Plan: 3 and Dexamethasone, Ondansetron, Midazolam and Treatment may vary due to age or medical condition  Airway Management Planned: Oral ETT  Additional Equipment:   Intra-op Plan:   Post-operative Plan:   Informed Consent: I have reviewed the patients History and Physical, chart, labs and discussed the procedure including the risks, benefits and alternatives for the proposed anesthesia with the patient or authorized representative who has indicated his/her understanding and acceptance.     Plan Discussed with:   Anesthesia Plan Comments:         Anesthesia Quick Evaluation

## 2017-10-11 NOTE — Op Note (Signed)
  Procedure Date:  10/11/2017  Pre-operative Diagnosis:  Symptomatic cholelithiasis  Post-operative Diagnosis:  Symptomatic cholelithiasis  Procedure:  Laparoscopic cholecystectomy  Surgeon:  Melvyn Neth, MD  Anesthesia:  General endotracheal  Estimated Blood Loss:  10 ml  Specimens:  gallbladder  Complications:  None  Indications for Procedure:  This is a 36 y.o. female who presents with abdominal pain and workup revealing symptomatic cholelithiasis.  The benefits, complications, treatment options, and expected outcomes were discussed with the patient. The risks of bleeding, infection, recurrence of symptoms, failure to resolve symptoms, bile duct damage, bile duct leak, retained common bile duct stone, bowel injury, and need for further procedures were all discussed with the patient and she was willing to proceed.  Description of Procedure: The patient was correctly identified in the preoperative area and brought into the operating room.  The patient was placed supine with VTE prophylaxis in place.  Appropriate time-outs were performed.  Anesthesia was induced and the patient was intubated.  Appropriate antibiotics were infused.  The abdomen was prepped and draped in a sterile fashion. An infraumbilical incision was made. A cutdown technique was used to enter the abdominal cavity without injury, and a Hasson trocar was inserted.  Pneumoperitoneum was obtained with appropriate opening pressures.  A 5-mm port was placed in the subxiphoid area and two 5-mm ports were placed in the right upper quadrant under direct visualization.  The gallbladder was identified.  The fundus was grasped and retracted cephalad.  Adhesions were lysed bluntly and with electrocautery. The infundibulum was grasped and retracted laterally, exposing the peritoneum overlying the gallbladder.  This was incised with electrocautery and extended on either side of the gallbladder.  The cystic duct and cystic artery  were clearly identified and bluntly dissected.  Both were clipped twice proximally and once distally, cutting in between.  The gallbladder was taken from the gallbladder fossa in a retrograde fashion with electrocautery. The gallbladder was placed in an Endocatch bag and brought out via the umbilical incision. The liver bed was inspected and any bleeding was controlled with electrocautery. The right upper quadrant was then inspected again revealing intact clips, no bleeding, and no ductal injury.  The area was thoroughly irrigated.  The 5 mm ports were removed under direct visualization and the Hasson trocar was removed.  The fascial opening was closed using 0 vicryl suture.  Local anesthetic was infused in all incisions and the incisions were closed with 4-0 Monocryl.  The wounds were cleaned and sealed with DermaBond.  The patient was emerged from anesthesia and extubated and brought to the recovery room for further management.  The patient tolerated the procedure well and all counts were correct at the end of the case.   Melvyn Neth, MD

## 2017-10-11 NOTE — Anesthesia Procedure Notes (Signed)
Procedure Name: Intubation Date/Time: 10/11/2017 9:25 AM Performed by: Johnna Acosta, CRNA Pre-anesthesia Checklist: Patient identified, Emergency Drugs available, Suction available, Patient being monitored and Timeout performed Patient Re-evaluated:Patient Re-evaluated prior to induction Oxygen Delivery Method: Circle system utilized Preoxygenation: Pre-oxygenation with 100% oxygen Induction Type: IV induction Ventilation: Mask ventilation without difficulty Laryngoscope Size: Miller, 2, Mac and 4 Grade View: Grade III Tube type: Oral Tube size: 7.5 mm Number of attempts: 2 Airway Equipment and Method: Stylet Placement Confirmation: ETT inserted through vocal cords under direct vision,  positive ETCO2 and breath sounds checked- equal and bilateral Secured at: 21 cm Tube secured with: Tape Dental Injury: Injury to tongue  Difficulty Due To: Difficulty was unanticipated, Difficult Airway- due to large tongue and Difficult Airway- due to anterior larynx Future Recommendations: Recommend- induction with short-acting agent, and alternative techniques readily available

## 2017-10-11 NOTE — Anesthesia Post-op Follow-up Note (Signed)
Anesthesia QCDR form completed.        

## 2017-10-11 NOTE — Interval H&P Note (Signed)
History and Physical Interval Note:  10/11/2017 8:41 AM  Kathryn Harding  has presented today for surgery, with the diagnosis of symptomatic cholelithiasis  The various methods of treatment have been discussed with the patient and family. After consideration of risks, benefits and other options for treatment, the patient has consented to  Procedure(s): LAPAROSCOPIC CHOLECYSTECTOMY (N/A) as a surgical intervention .  The patient's history has been reviewed, patient examined, no change in status, stable for surgery.  I have reviewed the patient's chart and labs.  Questions were answered to the patient's satisfaction.     Helyn Schwan

## 2017-10-12 ENCOUNTER — Encounter: Payer: Self-pay | Admitting: Surgery

## 2017-10-12 LAB — SURGICAL PATHOLOGY

## 2017-10-26 ENCOUNTER — Ambulatory Visit (INDEPENDENT_AMBULATORY_CARE_PROVIDER_SITE_OTHER): Payer: 59 | Admitting: Surgery

## 2017-10-26 ENCOUNTER — Encounter: Payer: Self-pay | Admitting: Surgery

## 2017-10-26 VITALS — BP 131/82 | HR 60 | Temp 97.7°F | Ht 65.0 in | Wt 229.8 lb

## 2017-10-26 DIAGNOSIS — Z4889 Encounter for other specified surgical aftercare: Secondary | ICD-10-CM

## 2017-10-26 DIAGNOSIS — K8020 Calculus of gallbladder without cholecystitis without obstruction: Secondary | ICD-10-CM

## 2017-10-26 NOTE — Progress Notes (Signed)
Surgical Clinic Progress/Follow-up Note   HPI:  36 y.o. Female presents to clinic for post-op follow-up evaluation s/p laparoscopic cholecystectomy for symptomatic cholelithiasis. Patient reports complete resolution of pre-operative post-prandial RUQ abdominal pain and has been tolerating regular diet with +flatus and normal BM's except a little GI upset and loose BM after eating pizza x1, otherwise improving mild peri-umbilical pain and denies N/V, fever/chills, CP, or SOB.  Review of Systems:  Constitutional: denies any other weight loss, fever, chills, or sweats  Eyes: denies any other vision changes, history of eye injury  ENT: denies sore throat, hearing problems  Respiratory: denies shortness of breath, wheezing  Cardiovascular: denies chest pain, palpitations  Gastrointestinal: abdominal pain, N/V, and bowel function as per HPI Musculoskeletal: denies any other joint pains or cramps  Skin: Denies any other rashes or skin discolorations  Neurological: denies any other headache, dizziness, weakness  Psychiatric: denies any other depression, anxiety  All other review of systems: otherwise negative   Vital Signs:  BP 131/82   Pulse 60   Temp 97.7 F (36.5 C) (Oral)   Ht 5\' 5"  (1.651 m)   Wt 229 lb 12.8 oz (104.2 kg)   BMI 38.24 kg/m    Physical Exam:  Constitutional:  -- Overweight body habitus  -- Awake, alert, and oriented x3  Eyes:  -- Pupils equally round and reactive to light  -- No scleral icterus  Ear, nose, throat:  -- No jugular venous distension  -- No nasal drainage, bleeding Pulmonary:  -- No crackles -- Equal breath sounds bilaterally -- Breathing non-labored at rest Cardiovascular:  -- S1, S2 present  -- No pericardial rubs  Gastrointestinal:  -- Soft and non-distended with minimal peri-umbilical tenderness to palpation, no guarding/rebound -- Incisions well-approximated without surrounding erythema or drainage except glue starting to peel from  umbilical incision with minimal ecchymosis -- No abdominal masses appreciated, pulsatile or otherwise  Musculoskeletal / Integumentary:  -- Wounds or skin discoloration: None appreciated except post-surgical wounds as described above (GI) -- Extremities: B/L UE and LE FROM, hands and feet warm, no edema  Neurologic:  -- Motor function: intact and symmetric  -- Sensation: intact and symmetric   Assessment:  36 y.o. yo Female with a problem list including...  Patient Active Problem List   Diagnosis Date Noted  . Symptomatic cholelithiasis   . Aphasia 12/10/2011  . TIA (transient ischemic attack) 12/10/2011    presents to clinic for post-op follow-up evaluation, doing well s/p laparoscopic cholecystectomy for symptomatic cholelithiasis.  Plan:              - advance diet as tolerated              - okay to submerge incisions under water (baths, swimming) prn             - gradually resume all activities without any sudden increase in lifting over next 2 weeks             - apply sunblock particularly to incisions with sun exposure to reduce pigmentation of scars             - return to clinic as needed, instructed to call office if any questions or concerns   All of the above recommendations were discussed with the patient and patient's family, and all of patient's and family's questions were answered to their expressed satisfaction.  -- Marilynne Drivers Rosana Hoes, MD, [place]: [facility] - Partnering for exceptional care.  Office: [phone]

## 2017-10-26 NOTE — Patient Instructions (Addendum)

## 2017-11-15 ENCOUNTER — Encounter: Payer: Self-pay | Admitting: Surgery

## 2017-11-15 ENCOUNTER — Ambulatory Visit (INDEPENDENT_AMBULATORY_CARE_PROVIDER_SITE_OTHER): Payer: 59 | Admitting: Surgery

## 2017-11-15 VITALS — BP 95/64 | HR 63 | Temp 98.2°F | Ht 65.0 in | Wt 226.0 lb

## 2017-11-15 DIAGNOSIS — Z09 Encounter for follow-up examination after completed treatment for conditions other than malignant neoplasm: Secondary | ICD-10-CM

## 2017-11-15 NOTE — Patient Instructions (Addendum)
Please start your antibiotics as directed and keep the wound covered.  We will see you back in office as listed below:  If you have any questions or concerns please give our office a call.

## 2017-11-15 NOTE — Progress Notes (Signed)
11/15/2017  HPI: Patient is s/p laparoscopic cholecystectomy on 10/11/17.  She presents for follow up.  She had been doing well until this past weekend that she presented to urgent care due to problems with her umbilical wound.  In urgent care she was told that the wound had opened and there was a possible wound infection.  She was given a course of ciprofloxacin.  She reports that now she has been doing well and does notice some drainage but the pain has improved.  Otherwise has not had issues with the other incisions and is currently with good appetite and bowel movements.  Vital signs: BP 95/64   Pulse 63   Temp 98.2 F (36.8 C) (Oral)   Ht 5\' 5"  (1.651 m)   Wt 102.5 kg (226 lb)   BMI 37.61 kg/m    Physical Exam: Constitutional: No acute distress Abdomen: Soft, obese, nondistended, nontender to palpation.  Umbilical incision has open about 1 cm in width, revealing fatty tissue with granulation tissue with no evidence of infection and no purulent drainage.  This area was treated with silver nitrate with no complications.  Dry gauze dressing was applied.  Assessment/Plan: 37 year old female status post laparoscopic cholecystectomy, with open and umbilical incision.  -Reassured patient currently the wound is healing okay with no further evidence of infection if there was any.  She can apply dry gauze dressing or Band-Aid to the wound daily until it closes up on its own.  No need for procedures or other antibiotics.  She can complete her course that was given in urgent care. -Patient will follow-up in 2 weeks to reassess her wound. -Patient may resume her usual activities.   Melvyn Neth, [place]

## 2017-11-16 ENCOUNTER — Encounter: Payer: 59 | Admitting: Surgery

## 2017-12-06 ENCOUNTER — Ambulatory Visit: Payer: Self-pay | Admitting: Surgery

## 2017-12-12 ENCOUNTER — Encounter: Payer: Self-pay | Admitting: Surgery

## 2017-12-12 ENCOUNTER — Ambulatory Visit (INDEPENDENT_AMBULATORY_CARE_PROVIDER_SITE_OTHER): Payer: 59 | Admitting: Surgery

## 2017-12-12 VITALS — BP 107/68 | HR 69 | Temp 98.7°F | Wt 224.0 lb

## 2017-12-12 DIAGNOSIS — K8020 Calculus of gallbladder without cholecystitis without obstruction: Secondary | ICD-10-CM

## 2017-12-12 NOTE — Patient Instructions (Signed)
Please give us a call in case you have any questions or concerns.  

## 2017-12-12 NOTE — Progress Notes (Signed)
Outpatient postop visit  12/12/2017  Kathryn Harding is an 37 y.o. female.    Procedure: Laparoscopic cholecystectomy  CC: Patient here for wound check  HPI: This patient here for wound check following up cholecystectomy in which she had some wound dehiscence.  She has no complaints at this time and states that her wound is healed.  She is eating well having no other problems  Medications reviewed.    Physical Exam:  There were no vitals taken for this visit.    PE: *Morbidly obese female patient in no acute distress no icterus no jaundice abdomen is soft nontender the periumbilical wound is clean with no erythema no drainage minimal dry eschar present nontender    Assessment/Plan:  Pathology was reviewed and benign.  Patient doing very well recommend follow-up on an as-needed basis wound is completely healed  Florene Glen, MD, FACS

## 2018-10-25 ENCOUNTER — Encounter: Payer: Self-pay | Admitting: Obstetrics and Gynecology

## 2018-10-25 ENCOUNTER — Ambulatory Visit (INDEPENDENT_AMBULATORY_CARE_PROVIDER_SITE_OTHER): Payer: 59 | Admitting: Obstetrics and Gynecology

## 2018-10-25 ENCOUNTER — Other Ambulatory Visit (HOSPITAL_COMMUNITY)
Admission: RE | Admit: 2018-10-25 | Discharge: 2018-10-25 | Disposition: A | Discharge status: Home / Self Care | Payer: 59 | Source: Ambulatory Visit | Attending: Obstetrics and Gynecology | Admitting: Obstetrics and Gynecology

## 2018-10-25 VITALS — BP 102/58 | HR 55 | Ht 65.0 in | Wt 219.0 lb

## 2018-10-25 DIAGNOSIS — Z0000 Encounter for general adult medical examination without abnormal findings: Secondary | ICD-10-CM | POA: Diagnosis present

## 2018-10-25 DIAGNOSIS — Z124 Encounter for screening for malignant neoplasm of cervix: Secondary | ICD-10-CM | POA: Diagnosis present

## 2018-10-25 DIAGNOSIS — Z01419 Encounter for gynecological examination (general) (routine) without abnormal findings: Secondary | ICD-10-CM | POA: Diagnosis not present

## 2018-10-25 DIAGNOSIS — Z1239 Encounter for other screening for malignant neoplasm of breast: Secondary | ICD-10-CM

## 2018-10-25 DIAGNOSIS — N939 Abnormal uterine and vaginal bleeding, unspecified: Secondary | ICD-10-CM

## 2018-10-25 NOTE — Progress Notes (Signed)
Gynecology Annual Exam   PCP: [place]  Chief Complaint:  Chief Complaint  Patient presents with  . Gynecologic Exam    Bleeding sense last month     History of Present Illness: Patient is a 37 y.o. G0P0000 presents for annual exam. The patient has no complaints today.   Patchin presents today complaining of irregular periods.  She states that since menarche at the age of 67 she has had irregular periods.  She reports that her periods occur about every 3 months.  She generally has 2 to 3 days of moderate bleeding and then they stop.  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  She had surgery which remove the ovarian cysts.  She reports that she still has her ovaries.  She reports that recently since mid November before Thanksgiving to now she has had on and off bleeding.  She reports that some days are heavy and she has passage of large clots.  On heavy days she has to change a super tampon 4-5 times a day.  She estimates 30 to 40% of the days are heavy.  The other days are light.  She denies any dizziness or lightheadedness.   She is not sexually active she is a virgin.  She has no history of sexually transmitted infections.  She has never had an abnormal Pap smear.  She has no history of uterine fibroids.  She reports that her last pelvic exam was approximately 6 years ago.   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  She states that she was in the [employer] when it happened and that she had breast imaging and ultrasound which was all normal.  LMP: No LMP recorded. Average Interval: irregular,  Dysmenorrhea: no  The patient is not sexually active. She currently uses abstinence for contraception. She does not dyspareunia.  The patient does perform self breast exams.  There is no notable family history of breast or ovarian cancer in her family.  The patient wears seatbelts: yes.    The patient has regular exercise: not asked.    The patient denies current symptoms of depression.    Review of Systems: ROS  Past Medical History:  Past Medical History:  Diagnosis Date  . Aphasia 12/10/2011  . Bell's palsy   . CVA (cerebral infarction)   . Gallbladder pain   . TIA (transient ischemic attack) 12/10/2011    Past Surgical History:  Past Surgical History:  Procedure Laterality Date  . CHOLECYSTECTOMY N/A 10/11/2017   Procedure: LAPAROSCOPIC CHOLECYSTECTOMY;  Surgeon: Olean Ree, MD;  Location: [facility] ORS;  Service: General;  Laterality: N/A;  . KNEE SURGERY    . OVARIAN CYST SURGERY      Gynecologic History:  No LMP recorded. Contraception: abstinence Last Pap: Results were: unknown   Obstetric History: G0P0000  Family History:  Family History  Problem Relation Age of Onset  . Hypertension Mother   . Healthy Father   . Liver cancer Paternal Uncle   . Liver cancer Paternal Uncle     Social History:  Social History   Socioeconomic History  . Marital status: Single    Spouse name: Not on file  . Number of children: Not on file  . Years of education: Not on file  . Highest education level: Not on file  Occupational History  . Not on  file  Social Needs  . Financial resource strain: Not on file  . Food insecurity:    Worry: Not on file    Inability: Not on file  . Transportation needs:    Medical: Not on file    Non-medical: Not on file  Tobacco Use  . Smoking status: Never Smoker  . Smokeless tobacco: Never Used  Substance and Sexual Activity  . Alcohol use: Yes    Alcohol/week: 2.0 standard drinks    Types: 2 Cans of beer per week  . Drug use: No  . Sexual activity: Never    Birth control/protection: None  Lifestyle  . Physical activity:    Days per week: Not on file    Minutes per session: Not on file  . Stress: Not on file  Relationships  . Social connections:    Talks on phone: Not on file    Gets together: Not on file     Attends religious service: Not on file    Active member of club or organization: Not on file    Attends meetings of clubs or organizations: Not on file    Relationship status: Not on file  . Intimate partner violence:    Fear of current or ex partner: Not on file    Emotionally abused: Not on file    Physically abused: Not on file    Forced sexual activity: Not on file  Other Topics Concern  . Not on file  Social History Narrative  . Not on file    Allergies:  Allergies  Allergen Reactions  . Sulfur Other (See Comments)    HEADACHES     Medications: Prior to Admission medications   Not on File    Physical Exam Vitals: Blood pressure (!) 102/58, pulse (!) 55, height 5\' 5"  (1.651 m), weight 219 lb (99.3 kg).  General: NAD HEENT: normocephalic, anicteric Thyroid: no enlargement, no palpable nodules Pulmonary: No increased work of breathing, CTAB Cardiovascular: RRR, distal pulses 2+ Breast: Breast symmetrical, no tenderness, no palpable nodules or masses, left nipple retraction present, no nipple discharge.  No axillary or supraclavicular lymphadenopathy. Abdomen: NABS, soft, non-tender, non-distended.  Umbilicus without lesions.  No hepatomegaly, splenomegaly or masses palpable. No evidence of hernia  Genitourinary:  External: Normal external female genitalia.  Normal urethral meatus, normal Bartholin's and Skene's glands.    Vagina: Normal vaginal mucosa, no evidence of prolapse.    Cervix: Grossly normal in appearance, small bleeding present.  Uterus: Non-enlarged, mobile, normal contour.  No CMT  Adnexa: ovaries non-enlarged, no adnexal masses  Rectal: deferred  Lymphatic: no evidence of inguinal lymphadenopathy Extremities: no edema, erythema, or tenderness Neurologic: Grossly intact Psychiatric: mood appropriate, affect full  Female chaperone present for pelvic and breast  portions of the physical exam    Assessment: 37 y.o. G0P0000 routine annual  exam  Plan: Problem List Items Addressed This Visit    None    Visit Diagnoses    Cervical cancer screening    -  Primary   Relevant Orders   Cytology - PAP   Health care maintenance       Relevant Orders   Cytology - PAP   Basic Metabolic Panel (BMET)   Screening breast examination       Abnormal uterine bleeding (AUB)       Relevant Orders   TSH+Prl+FSH+TestT+LH+DHEA S...   T4, free   CBC   US PELVIS (TRANSABDOMINAL ONLY)      2) STI screening  was  notoffered and therefore not obtained  2)  ASCCP guidelines and rational discussed.  Patient opts for every 5 years screening interval  3) Contraception - the patient is currently using  none.  She is happy with her current form of contraception and plans to continue  4) Routine healthcare maintenance including cholesterol, diabetes screening discussed managed by PCP  5) Return in about 1 week (around 11/01/2018) for [place] and return GYN visit.  Discussed evaluation of abnormal uterine bleeding.  PCOS labs to be obtained today for irregular periods.  Will have patient return for a ultrasound.  Discussed that she since she is over 35-minute is advised to have endometrial sampling.  Patient would prefer for this to be performed in the ER because of her virginal status.  We will plan for to schedule patient for hysteroscopy D&C at her next visit.  We also discussed possible Mirena insertion at that time.   Adrian Prows MD [facility], [facility] 10/25/2018 9:13 AM

## 2018-10-30 LAB — CBC
HEMOGLOBIN: 13 g/dL (ref 11.1–15.9)
Hematocrit: 39.5 % (ref 34.0–46.6)
MCH: 30 pg (ref 26.6–33.0)
MCHC: 32.9 g/dL (ref 31.5–35.7)
MCV: 91 fL (ref 79–97)
Platelets: 288 10*3/uL (ref 150–450)
RBC: 4.33 x10E6/uL (ref 3.77–5.28)
RDW: 12.9 % (ref 12.3–15.4)
WBC: 7.6 10*3/uL (ref 3.4–10.8)

## 2018-10-30 LAB — BASIC METABOLIC PANEL
BUN/Creatinine Ratio: 17 (ref 9–23)
BUN: 14 mg/dL (ref 6–20)
CO2: 22 mmol/L (ref 20–29)
CREATININE: 0.83 mg/dL (ref 0.57–1.00)
Calcium: 9.9 mg/dL (ref 8.7–10.2)
Chloride: 104 mmol/L (ref 96–106)
GFR calc Af Amer: 104 mL/min/{1.73_m2} (ref >59)
GFR calc non Af Amer: 90 mL/min/{1.73_m2} (ref >59)
GLUCOSE: 88 mg/dL (ref 65–99)
Potassium: 4.1 mmol/L (ref 3.5–5.2)
SODIUM: 141 mmol/L (ref 134–144)

## 2018-10-30 LAB — CYTOLOGY - PAP
Diagnosis: NEGATIVE
HPV (WINDOPATH): NOT DETECTED

## 2018-10-30 LAB — TSH+PRL+FSH+TESTT+LH+DHEA S...
17-Hydroxyprogesterone: 90 ng/dL
ANDROSTENEDIONE: 95 ng/dL (ref 41–262)
DHEA-SO4: 116.7 ug/dL (ref 57.3–279.2)
FSH: 3.4 m[IU]/mL
LH: 8 m[IU]/mL
Prolactin: 7.4 ng/mL (ref 4.8–23.3)
TESTOSTERONE: 22 ng/dL (ref 8–48)
TSH: 1.28 u[IU]/mL (ref 0.450–4.500)
Testosterone, Free: 2.2 pg/mL (ref 0.0–4.2)

## 2018-10-30 LAB — T4, FREE: FREE T4: 1.3 ng/dL (ref 0.82–1.77)

## 2018-11-08 ENCOUNTER — Ambulatory Visit (INDEPENDENT_AMBULATORY_CARE_PROVIDER_SITE_OTHER): Payer: 59 | Admitting: Obstetrics and Gynecology

## 2018-11-08 ENCOUNTER — Ambulatory Visit (INDEPENDENT_AMBULATORY_CARE_PROVIDER_SITE_OTHER): Payer: 59

## 2018-11-08 ENCOUNTER — Encounter: Payer: Self-pay | Admitting: Obstetrics and Gynecology

## 2018-11-08 ENCOUNTER — Other Ambulatory Visit: Payer: Self-pay | Admitting: Obstetrics and Gynecology

## 2018-11-08 ENCOUNTER — Telehealth: Payer: Self-pay | Admitting: Obstetrics and Gynecology

## 2018-11-08 VITALS — BP 110/68 | HR 55 | Ht 65.0 in | Wt 224.0 lb

## 2018-11-08 DIAGNOSIS — D252 Subserosal leiomyoma of uterus: Secondary | ICD-10-CM

## 2018-11-08 DIAGNOSIS — N939 Abnormal uterine and vaginal bleeding, unspecified: Secondary | ICD-10-CM | POA: Diagnosis not present

## 2018-11-08 DIAGNOSIS — R9389 Abnormal findings on diagnostic imaging of other specified body structures: Secondary | ICD-10-CM | POA: Diagnosis not present

## 2018-11-08 NOTE — H&P (View-Only) (Signed)
Patient ID: Kathryn Harding, female   DOB: June 07, 1981, 38 y.o.   MRN: [id number]  Reason for Consult: Follow-up (U/S follow up, still bleeding )   [facility]  Subjective:     HPI:  Kathryn Harding is a 38 y.o. female She is following up today for an [place] and because of her abnormal uterine bleeding. She tells me today that she is in a lesbian relationship and that she would like to become pregnant in the near future. She is going to be getting married. She will be the likely carrier of the baby because her partner is older (48) and has diabetes. She does not want an IUD to help control bleeding or birth control at this time.  Past Medical History:  Diagnosis Date  . Aphasia 12/10/2011  . Bell's palsy   . CVA (cerebral infarction)   . Gallbladder pain   . TIA (transient ischemic attack) 12/10/2011   Family History  Problem Relation Age of Onset  . Hypertension Mother   . Healthy Father   . Liver cancer Paternal Uncle   . Liver cancer Paternal Uncle    Past Surgical History:  Procedure Laterality Date  . CHOLECYSTECTOMY N/A 10/11/2017   Procedure: LAPAROSCOPIC CHOLECYSTECTOMY;  Surgeon: Olean Ree, MD;  Location: [facility] ORS;  Service: General;  Laterality: N/A;  . KNEE SURGERY    . OVARIAN CYST SURGERY      Short Social History:  Social History   Tobacco Use  . Smoking status: Never Smoker  . Smokeless tobacco: Never Used  Substance Use Topics  . Alcohol use: Yes    Alcohol/week: 2.0 standard drinks    Types: 2 Cans of beer per week    Allergies  Allergen Reactions  . Sulfur Other (See Comments)    HEADACHES     No current outpatient medications on file.   No current facility-administered medications for this visit.     Review of Systems  Constitutional: Negative for chills, fatigue, fever and unexpected weight change.  HENT: Negative for trouble swallowing.  Eyes: Negative for loss of vision.  Respiratory: Negative for cough, shortness of  breath and wheezing.  Cardiovascular: Negative for chest pain, leg swelling, palpitations and syncope.  GI: Negative for abdominal pain, blood in stool, diarrhea, nausea and vomiting.  GU: Negative for difficulty urinating, dysuria, frequency and hematuria.  Musculoskeletal: Negative for back pain, leg pain and joint pain.  Skin: Negative for rash.  Neurological: Negative for dizziness, headaches, light-headedness, numbness and seizures.  Psychiatric: Negative for behavioral problem, confusion, depressed mood and sleep disturbance.        Objective:  Objective   Vitals:   11/08/18 1127  BP: 110/68  Pulse: (!) 55  Weight: 224 lb (101.6 kg)  Height: 5\' 5"  (1.651 m)   Body mass index is 37.28 kg/m.  Physical Exam Vitals signs and nursing note reviewed.  Constitutional:      Appearance: She is well-developed.  HENT:     Head: Normocephalic and atraumatic.  Eyes:     Pupils: Pupils are equal, round, and reactive to light.  Cardiovascular:     Rate and Rhythm: Normal rate and regular rhythm.  Pulmonary:     Effort: Pulmonary effort is normal. No respiratory distress.  Skin:    General: Skin is warm and dry.  Neurological:     Mental Status: She is alert and oriented to person, place, and time.  Psychiatric:  Behavior: Behavior normal.        Thought Content: Thought content normal.        Judgment: Judgment normal.        Assessment/Plan:     38 yo with Abnormal uterine bleeding and thickened endometrium Will schedule for hysteroscopy dilation and curettage.  Risks and benefits of this procedure discussed in detail with the patient including uterine perforation bleeding, infection, and hysterectomy. Patient stated understanding and desire to have the procedure performed.  Will need referral to reproductive specialist for pregnancy assistance after the procedure  More than 25 minutes were spent face to face with the patient in the room with more than 50% of the  time spent providing counseling and discussing the plan of management.    Adrian Prows MD [facility] 11/08/2018 12:09 PM

## 2018-11-08 NOTE — Progress Notes (Signed)
Patient ID: Kathryn Harding, female   DOB: 10-26-1981, 38 y.o.   MRN: [id number]  Reason for Consult: Follow-up (U/S follow up, still bleeding )   [facility]  Subjective:     HPI:  Kathryn Harding is a 38 y.o. female She is following up today for an [place] and because of her abnormal uterine bleeding. She tells me today that she is in a lesbian relationship and that she would like to become pregnant in the near future. She is going to be getting married. She will be the likely carrier of the baby because her partner is older (40) and has diabetes. She does not want an IUD to help control bleeding or birth control at this time.  Past Medical History:  Diagnosis Date  . Aphasia 12/10/2011  . Bell's palsy   . CVA (cerebral infarction)   . Gallbladder pain   . TIA (transient ischemic attack) 12/10/2011   Family History  Problem Relation Age of Onset  . Hypertension Mother   . Healthy Father   . Liver cancer Paternal Uncle   . Liver cancer Paternal Uncle    Past Surgical History:  Procedure Laterality Date  . CHOLECYSTECTOMY N/A 10/11/2017   Procedure: LAPAROSCOPIC CHOLECYSTECTOMY;  Surgeon: Olean Ree, MD;  Location: [facility] ORS;  Service: General;  Laterality: N/A;  . KNEE SURGERY    . OVARIAN CYST SURGERY      Short Social History:  Social History   Tobacco Use  . Smoking status: Never Smoker  . Smokeless tobacco: Never Used  Substance Use Topics  . Alcohol use: Yes    Alcohol/week: 2.0 standard drinks    Types: 2 Cans of beer per week    Allergies  Allergen Reactions  . Sulfur Other (See Comments)    HEADACHES     No current outpatient medications on file.   No current facility-administered medications for this visit.     Review of Systems  Constitutional: Negative for chills, fatigue, fever and unexpected weight change.  HENT: Negative for trouble swallowing.  Eyes: Negative for loss of vision.  Respiratory: Negative for cough, shortness of  breath and wheezing.  Cardiovascular: Negative for chest pain, leg swelling, palpitations and syncope.  GI: Negative for abdominal pain, blood in stool, diarrhea, nausea and vomiting.  GU: Negative for difficulty urinating, dysuria, frequency and hematuria.  Musculoskeletal: Negative for back pain, leg pain and joint pain.  Skin: Negative for rash.  Neurological: Negative for dizziness, headaches, light-headedness, numbness and seizures.  Psychiatric: Negative for behavioral problem, confusion, depressed mood and sleep disturbance.        Objective:  Objective   Vitals:   11/08/18 1127  BP: 110/68  Pulse: (!) 55  Weight: 224 lb (101.6 kg)  Height: 5\' 5"  (1.651 m)   Body mass index is 37.28 kg/m.  Physical Exam Vitals signs and nursing note reviewed.  Constitutional:      Appearance: She is well-developed.  HENT:     Head: Normocephalic and atraumatic.  Eyes:     Pupils: Pupils are equal, round, and reactive to light.  Cardiovascular:     Rate and Rhythm: Normal rate and regular rhythm.  Pulmonary:     Effort: Pulmonary effort is normal. No respiratory distress.  Skin:    General: Skin is warm and dry.  Neurological:     Mental Status: She is alert and oriented to person, place, and time.  Psychiatric:  Behavior: Behavior normal.        Thought Content: Thought content normal.        Judgment: Judgment normal.        Assessment/Plan:     38 yo with Abnormal uterine bleeding and thickened endometrium Will schedule for hysteroscopy dilation and curettage.  Risks and benefits of this procedure discussed in detail with the patient including uterine perforation bleeding, infection, and hysterectomy. Patient stated understanding and desire to have the procedure performed.  Will need referral to reproductive specialist for pregnancy assistance after the procedure  More than 25 minutes were spent face to face with the patient in the room with more than 50% of the  time spent providing counseling and discussing the plan of management.    Adrian Prows MD [facility] 11/08/2018 12:09 PM

## 2018-11-08 NOTE — Telephone Encounter (Signed)
-----   Message from Homero Fellers, MD sent at 11/08/2018 12:11 PM EST ----- Regarding: surgery scheduling Surgery Booking Request Patient Full Name:  Kathryn Harding  MRN: [id number]  DOB: September 05, 1981  Surgeon: Homero Fellers, MD  Requested Surgery Date and Time: January of february Primary Diagnosis AND Code:Abnormal uterine bleeding, thickened endometrium Secondary Diagnosis and Code:  Surgical Procedure: hysteroscopy D&C L&D Notification: No Admission Status: same day surgery Length of Surgery: 1 hour Special Case Needs: none H&P: consents signed 11/08/2018 (date) Phone Interview???: yes Interpreter: Language:  Medical Clearance: no  Special Scheduling Instructions: consents signed 11/08/2018 in office

## 2018-11-08 NOTE — Telephone Encounter (Signed)
Patient is aware of Pre-admit Testing phone interview to be scheduled and will watch for the notification in [place], and OR on 12/04/2018. Patient is aware she may receive calls from the [place] and [facility]. Patient confirmed [place] and no secondary insurance. Ext given.

## 2018-11-12 NOTE — Progress Notes (Signed)
LH: FSH ratio suggests PCOS. Will discuss further at next office visit. Note sent to mychart.

## 2018-11-12 NOTE — Telephone Encounter (Signed)
Completed thank you

## 2018-11-23 ENCOUNTER — Encounter
Admission: RE | Admit: 2018-11-23 | Discharge: 2018-11-23 | Disposition: A | Discharge status: Home / Self Care | Payer: 59 | Source: Ambulatory Visit | Attending: Obstetrics and Gynecology | Admitting: Obstetrics and Gynecology

## 2018-11-23 ENCOUNTER — Other Ambulatory Visit: Payer: Self-pay

## 2018-11-23 DIAGNOSIS — Z01818 Encounter for other preprocedural examination: Secondary | ICD-10-CM | POA: Insufficient documentation

## 2018-11-23 DIAGNOSIS — Z8673 Personal history of transient ischemic attack (TIA), and cerebral infarction without residual deficits: Secondary | ICD-10-CM

## 2018-11-23 HISTORY — DX: Excessive and frequent menstruation with regular cycle: N92.0

## 2018-11-23 LAB — CBC
HCT: 37.6 % (ref 36.0–46.0)
Hemoglobin: 12.3 g/dL (ref 12.0–15.0)
MCH: 29.4 pg (ref 26.0–34.0)
MCHC: 32.7 g/dL (ref 30.0–36.0)
MCV: 90 fL (ref 80.0–100.0)
Platelets: 262 10*3/uL (ref 150–400)
RBC: 4.18 MIL/uL (ref 3.87–5.11)
RDW: 12.9 % (ref 11.5–15.5)
WBC: 8.8 10*3/uL (ref 4.0–10.5)
nRBC: 0 % (ref 0.0–0.2)

## 2018-11-23 LAB — BASIC METABOLIC PANEL
Anion gap: 5 (ref 5–15)
BUN: 10 mg/dL (ref 6–20)
CO2: 23 mmol/L (ref 22–32)
Calcium: 8.7 mg/dL — ABNORMAL LOW (ref 8.9–10.3)
Chloride: 110 mmol/L (ref 98–111)
Creatinine, Ser: 0.54 mg/dL (ref 0.44–1.00)
GFR calc Af Amer: >60 mL/min (ref >60)
GFR calc non Af Amer: >60 mL/min (ref >60)
Glucose, Bld: 93 mg/dL (ref 70–99)
Potassium: 3.7 mmol/L (ref 3.5–5.1)
Sodium: 138 mmol/L (ref 135–145)

## 2018-11-23 LAB — TYPE AND SCREEN
ABO/RH(D): O POS
Antibody Screen: NEGATIVE

## 2018-11-23 NOTE — Patient Instructions (Addendum)
Your procedure is scheduled on: 12/04/18 Tues Report to Same Day Surgery 2nd floor medical mall [facility] Entrance-take elevator on left to 2nd floor.  Check in with surgery information desk.) To find out your arrival time please call [phone] between 1PM - 3PM on 127/20 Mon  Remember: Instructions that are not followed completely may result in serious medical risk, up to and including death, or upon the discretion of your surgeon and anesthesiologist your surgery may need to be rescheduled.    _x___ 1. Do not eat food after midnight the night before your procedure. You may drink clear liquids up to 2 hours before you are scheduled to arrive at the hospital for your procedure.  Do not drink clear liquids within 2 hours of your scheduled arrival to the hospital.  Clear liquids include  --Water or Apple juice without pulp  --Clear carbohydrate beverage such as ClearFast or Gatorade  --Black Coffee or Clear Tea (No milk, no creamers, do not add anything to                  the coffee or Tea Type 1 and type 2 diabetics should only drink water.   ____Ensure clear carbohydrate drink on the way to the hospital for bariatric patients  ____Ensure clear carbohydrate drink 3 hours before surgery for Dr Dwyane Luo patients if physician instructed.   No gum chewing or hard candies.     __x__ 2. No Alcohol for 24 hours before or after surgery.   __x__3. No Smoking or e-cigarettes for 24 prior to surgery.  Do not use any chewable tobacco products for at least 6 hour prior to surgery   ____  4. Bring all medications with you on the day of surgery if instructed.    __x__ 5. Notify your doctor if there is any change in your medical condition     (cold, fever, infections).    x___6. On the morning of surgery brush your teeth with toothpaste and water.  You may rinse your mouth with mouth wash if you wish.  Do not swallow any toothpaste or mouthwash.   Do not wear jewelry, make-up, hairpins,  clips or nail polish.  Do not wear lotions, powders, or perfumes. You may wear deodorant.  Do not shave 48 hours prior to surgery. Men may shave face and neck.  Do not bring valuables to the hospital.    [facility] is not responsible for any belongings or valuables.               Contacts, dentures or bridgework may not be worn into surgery.  Leave your suitcase in the car. After surgery it may be brought to your room.  For patients admitted to the hospital, discharge time is determined by your                       treatment team.  _  Patients discharged the day of surgery will not be allowed to drive home.  You will need someone to drive you home and stay with you the night of your procedure.    Please read over the following fact sheets that you were given:   [facility] Preparing for Surgery and or MRSA Information   _x___ Take anti-hypertensive listed below, cardiac, seizure, asthma,     anti-reflux and psychiatric medicines. These include:  1. None  2.  3.  4.  5.  6.  ____Fleets enema or Magnesium Citrate as directed.  ____ Use CHG Soap or sage wipes as directed on instruction sheet   ____ Use inhalers on the day of surgery and bring to hospital day of surgery  ____ Stop Metformin and Janumet 2 days prior to surgery.    ____ Take 1/2 of usual insulin dose the night before surgery and none on the morning     surgery.   _x___ Follow recommendations from Cardiologist, Pulmonologist or PCP regarding          stopping Aspirin, Coumadin, Plavix ,Eliquis, Effient, or Pradaxa, and Pletal.  X____Stop Anti-inflammatories such as Advil, Aleve, Ibuprofen, Motrin, Naproxen, Naprosyn, Goodies powders or aspirin products. OK to take Tylenol and                          Celebrex.   _x___ Stop supplements until after surgery.  But may continue Vitamin D, Vitamin B,       and multivitamin.   ____ Bring C-Pap to the hospital.

## 2018-11-26 ENCOUNTER — Telehealth: Payer: Self-pay | Admitting: Obstetrics and Gynecology

## 2018-11-26 ENCOUNTER — Telehealth: Payer: Self-pay | Admitting: Cardiovascular Disease

## 2018-11-26 ENCOUNTER — Other Ambulatory Visit: Payer: Self-pay | Admitting: Obstetrics and Gynecology

## 2018-11-26 DIAGNOSIS — R001 Bradycardia, unspecified: Secondary | ICD-10-CM

## 2018-11-26 NOTE — Telephone Encounter (Signed)
Call placed to [facility] at Dr. Gennette Pac office, ext [phone]. She has been informed that the earliest the patient could be seen is 12/14/2018 with Dr. Rockey Situ. The patient is a new patient to the practice and scheduled for surgery on 1/28.   Izora Gala will keep the office updated on the clearance and surgery. If an opening comes up for the patient, the office will call and let them know.

## 2018-11-26 NOTE — Telephone Encounter (Signed)
   [facility] Pre-operative Risk Assessment    Request for surgical clearance:  1. What type of surgery is being performed? Hysteroscopy dilation and curettage  2. When is this surgery scheduled? 12/04/18   3. What type of clearance is required (medical clearance vs. Pharmacy clearance to hold med vs. Both)? medical  4. Are there any medications that need to be held prior to surgery and how long?not listed   5. Practice name and name of physician performing surgery? Dr. Adrian Prows  6. What is your office phone number [phone]   7.   What is your office fax [phone]  8.   Anesthesia type (None, local, MAC, general) ? Not listed   Lucienne Minks 11/26/2018, 3:08 PM  _________________________________________________________________   (provider comments below)

## 2018-11-26 NOTE — Telephone Encounter (Signed)
Patient states that her cardiology appointment for clearance is scheduled after her surgery.

## 2018-11-27 NOTE — Telephone Encounter (Signed)
Patient is aware of appointment w/ Dr Clayborn Bigness @ [facility] scheduled for tomorrow, 11/28/18 @ 2:45pm.

## 2018-11-28 NOTE — Telephone Encounter (Signed)
Patient was able to get appointment with Dr. Clayborn Bigness for today and will not be coming in to see [place] for her clearance. Will delete any recall and appointment.

## 2018-12-03 NOTE — Pre-Procedure Instructions (Signed)
CARDIAC CLEARANCE ON CHART FROM DR CALLWOOD-LOW RISK

## 2018-12-04 ENCOUNTER — Ambulatory Visit: Payer: 59 | Admitting: Registered Nurse

## 2018-12-04 ENCOUNTER — Encounter
Admission: RE | Disposition: A | Discharge status: Home / Self Care | Payer: Self-pay | Source: Home / Self Care | Attending: Obstetrics and Gynecology

## 2018-12-04 ENCOUNTER — Ambulatory Visit
Admission: RE | Admit: 2018-12-04 | Discharge: 2018-12-04 | Disposition: A | Discharge status: Home / Self Care | Payer: 59 | Attending: Obstetrics and Gynecology | Admitting: Obstetrics and Gynecology

## 2018-12-04 ENCOUNTER — Other Ambulatory Visit: Payer: Self-pay

## 2018-12-04 DIAGNOSIS — N939 Abnormal uterine and vaginal bleeding, unspecified: Secondary | ICD-10-CM | POA: Insufficient documentation

## 2018-12-04 DIAGNOSIS — N711 Chronic inflammatory disease of uterus: Secondary | ICD-10-CM

## 2018-12-04 DIAGNOSIS — Z8673 Personal history of transient ischemic attack (TIA), and cerebral infarction without residual deficits: Secondary | ICD-10-CM | POA: Diagnosis not present

## 2018-12-04 DIAGNOSIS — N938 Other specified abnormal uterine and vaginal bleeding: Secondary | ICD-10-CM | POA: Diagnosis not present

## 2018-12-04 DIAGNOSIS — R9389 Abnormal findings on diagnostic imaging of other specified body structures: Secondary | ICD-10-CM

## 2018-12-04 HISTORY — PX: DILATATION & CURETTAGE/HYSTEROSCOPY WITH MYOSURE: SHX6511

## 2018-12-04 LAB — POCT PREGNANCY, URINE: PREG TEST UR: NEGATIVE

## 2018-12-04 LAB — ABO/RH: ABO/RH(D): O POS

## 2018-12-04 SURGERY — DILATATION & CURETTAGE/HYSTEROSCOPY WITH MYOSURE
Anesthesia: General

## 2018-12-04 MED ORDER — LACTATED RINGERS IV SOLN
INTRAVENOUS | Status: DC
  Administered 2018-12-04: 09:00:00 via INTRAVENOUS

## 2018-12-04 MED ORDER — IBUPROFEN 600 MG PO TABS: 600.0000 mg | ORAL_TABLET | Freq: Four times a day (QID) | ORAL | 3 refills | Status: DC | PRN

## 2018-12-04 MED ORDER — MIDAZOLAM HCL 2 MG/2ML IJ SOLN
INTRAMUSCULAR | Status: DC | PRN
  Administered 2018-12-04 (×2): 1 mg via INTRAVENOUS

## 2018-12-04 MED ORDER — ONDANSETRON HCL 4 MG/2ML IJ SOLN
INTRAMUSCULAR | Status: AC
  Filled 2018-12-04: qty 2

## 2018-12-04 MED ORDER — LIDOCAINE HCL (PF) 2 % IJ SOLN
INTRAMUSCULAR | Status: AC
  Filled 2018-12-04: qty 10

## 2018-12-04 MED ORDER — FAMOTIDINE 20 MG PO TABS
20.0000 mg | ORAL_TABLET | Freq: Once | ORAL | Status: AC
  Administered 2018-12-04: 20 mg via ORAL

## 2018-12-04 MED ORDER — KETOROLAC TROMETHAMINE 30 MG/ML IJ SOLN
INTRAMUSCULAR | Status: AC
  Filled 2018-12-04: qty 1

## 2018-12-04 MED ORDER — FAMOTIDINE 20 MG PO TABS
ORAL_TABLET | ORAL | Status: AC
  Filled 2018-12-04: qty 1

## 2018-12-04 MED ORDER — PROMETHAZINE HCL 25 MG/ML IJ SOLN: 12.5000 mg | Freq: Once | INTRAMUSCULAR | Status: DC | PRN

## 2018-12-04 MED ORDER — HYDROMORPHONE HCL 1 MG/ML IJ SOLN: 0.2500 mg | INTRAMUSCULAR | Status: DC | PRN

## 2018-12-04 MED ORDER — PROPOFOL 10 MG/ML IV BOLUS
INTRAVENOUS | Status: AC
  Filled 2018-12-04: qty 40

## 2018-12-04 MED ORDER — SUCCINYLCHOLINE CHLORIDE 20 MG/ML IJ SOLN
INTRAMUSCULAR | Status: AC
  Filled 2018-12-04: qty 1

## 2018-12-04 MED ORDER — KETOROLAC TROMETHAMINE 30 MG/ML IJ SOLN
INTRAMUSCULAR | Status: DC | PRN
  Administered 2018-12-04: 30 mg via INTRAVENOUS

## 2018-12-04 MED ORDER — ONDANSETRON HCL 4 MG/2ML IJ SOLN
INTRAMUSCULAR | Status: DC | PRN
  Administered 2018-12-04: 4 mg via INTRAVENOUS

## 2018-12-04 MED ORDER — ACETAMINOPHEN 500 MG PO TABS: 1000.0000 mg | ORAL_TABLET | Freq: Four times a day (QID) | ORAL | 2 refills | Status: DC | PRN

## 2018-12-04 MED ORDER — PHENYLEPHRINE HCL 10 MG/ML IJ SOLN
INTRAMUSCULAR | Status: AC
  Filled 2018-12-04: qty 1

## 2018-12-04 MED ORDER — MIDAZOLAM HCL 2 MG/2ML IJ SOLN
INTRAMUSCULAR | Status: AC
  Filled 2018-12-04: qty 2

## 2018-12-04 MED ORDER — LIDOCAINE HCL (CARDIAC) PF 100 MG/5ML IV SOSY
PREFILLED_SYRINGE | INTRAVENOUS | Status: DC | PRN
  Administered 2018-12-04: 100 mg via INTRAVENOUS

## 2018-12-04 MED ORDER — DEXAMETHASONE SODIUM PHOSPHATE 10 MG/ML IJ SOLN
INTRAMUSCULAR | Status: DC | PRN
  Administered 2018-12-04: 10 mg via INTRAVENOUS

## 2018-12-04 MED ORDER — FENTANYL CITRATE (PF) 100 MCG/2ML IJ SOLN
INTRAMUSCULAR | Status: AC
  Filled 2018-12-04: qty 2

## 2018-12-04 MED ORDER — DEXAMETHASONE SODIUM PHOSPHATE 10 MG/ML IJ SOLN
INTRAMUSCULAR | Status: AC
  Filled 2018-12-04: qty 1

## 2018-12-04 MED ORDER — PROPOFOL 10 MG/ML IV BOLUS
INTRAVENOUS | Status: DC | PRN
  Administered 2018-12-04: 50 mg via INTRAVENOUS
  Administered 2018-12-04: 200 mg via INTRAVENOUS

## 2018-12-04 MED ORDER — FENTANYL CITRATE (PF) 100 MCG/2ML IJ SOLN
INTRAMUSCULAR | Status: DC | PRN
  Administered 2018-12-04 (×4): 25 ug via INTRAVENOUS

## 2018-12-04 SURGICAL SUPPLY — 25 items
BAG COUNTER SPONGE EZ (MISCELLANEOUS) ×2 IMPLANT
CATH ROBINSON RED A/P 16FR (CATHETERS) ×3 IMPLANT
COUNTER SPONGE BAG EZ (MISCELLANEOUS) ×1
DEVICE MYOSURE LITE (MISCELLANEOUS) IMPLANT
DEVICE MYOSURE REACH (MISCELLANEOUS) IMPLANT
ELECT REM PT RETURN 9FT ADLT (ELECTROSURGICAL) ×3
ELECTRODE REM PT RTRN 9FT ADLT (ELECTROSURGICAL) ×1 IMPLANT
GLOVE BIO SURGEON STRL SZ 6.5 (GLOVE) ×6 IMPLANT
GLOVE BIO SURGEONS STRL SZ 6.5 (GLOVE) ×3
GLOVE BIOGEL PI IND STRL 6.5 (GLOVE) ×3 IMPLANT
GLOVE BIOGEL PI INDICATOR 6.5 (GLOVE) ×6
GOWN STRL REUS W/ TWL LRG LVL3 (GOWN DISPOSABLE) ×2 IMPLANT
GOWN STRL REUS W/TWL LRG LVL3 (GOWN DISPOSABLE) ×4
KIT PROCEDURE FLUENT (KITS) IMPLANT
KIT TUBING HYSTEROLUX TRUCLEAR (ABLATOR) ×3 IMPLANT
PACK DNC HYST (MISCELLANEOUS) ×3 IMPLANT
PAD OB MATERNITY 4.3X12.25 (PERSONAL CARE ITEMS) ×3 IMPLANT
PAD PREP 24X41 OB/GYN DISP (PERSONAL CARE ITEMS) ×3 IMPLANT
SEAL HYSTERSCOPE TRUCLEAR ABLA (ABLATOR) ×3 IMPLANT
SOL .9 NS 3000ML IRR  AL (IV SOLUTION) ×2
SOL .9 NS 3000ML IRR UROMATIC (IV SOLUTION) ×1 IMPLANT
STRAP SAFETY 5IN WIDE (MISCELLANEOUS) ×3 IMPLANT
TOWEL OR 17X26 4PK STRL BLUE (TOWEL DISPOSABLE) ×3 IMPLANT
TUBING CONNECTING 10 (TUBING) ×2 IMPLANT
TUBING CONNECTING 10' (TUBING) ×1

## 2018-12-04 NOTE — Transfer of Care (Signed)
Immediate Anesthesia Transfer of Care Note  Patient: Kathryn Harding  Procedure(s) Performed: DILATATION & CURETTAGE/HYSTEROSCOPY (N/A )  Patient Location: PACU  Anesthesia Type:General  Level of Consciousness: awake  Airway & Oxygen Therapy: Patient Spontanous Breathing  Post-op Assessment: Report given to RN  Post vital signs: stable  Last Vitals:  Vitals Value Taken Time  BP 129/76 12/04/2018 10:28 AM  Temp 36.1 C 12/04/2018 10:28 AM  Pulse 72 12/04/2018 10:32 AM  Resp 10 12/04/2018 10:32 AM  SpO2 100 % 12/04/2018 10:32 AM  Vitals shown include unvalidated device data.  Last Pain:  Vitals:   12/04/18 1028  TempSrc:   PainSc: 0-No pain         Complications: No apparent anesthesia complications

## 2018-12-04 NOTE — Anesthesia Preprocedure Evaluation (Addendum)
Anesthesia Evaluation  Patient identified by MRN, date of birth, ID band Patient awake    Reviewed: Allergy & Precautions, H&P , NPO status , Patient's Chart, lab work & pertinent test results  Airway Mallampati: II  TM Distance: >3 FB     Dental  (+) Teeth Intact   Pulmonary neg pulmonary ROS,           Cardiovascular negative cardio ROS       Neuro/Psych TIA Neuromuscular disease (bell's palsy) negative psych ROS   GI/Hepatic negative GI ROS, Neg liver ROS, neg GERD  ,  Endo/Other  negative endocrine ROS  Renal/GU      Musculoskeletal   Abdominal   Peds  Hematology negative hematology ROS (+)   Anesthesia Other Findings obese  Past Medical History: 12/10/2011: Aphasia No date: Bell's palsy No date: Bell's palsy     Comment:  Lt side of face No date: CVA (cerebral infarction) No date: Gallbladder pain No date: Heavy menses 12/10/2011: TIA (transient ischemic attack) No date: TIA (transient ischemic attack)  Past Surgical History: 10/11/2017: CHOLECYSTECTOMY; N/A     Comment:  Procedure: LAPAROSCOPIC CHOLECYSTECTOMY;  Surgeon:               Olean Ree, MD;  Location: [facility] ORS;  Service:               General;  Laterality: N/A; No date: KNEE SURGERY No date: OVARIAN CYST SURGERY     Reproductive/Obstetrics negative OB ROS                           Anesthesia Physical Anesthesia Plan  ASA: III  Anesthesia Plan: General LMA   Post-op Pain Management:    Induction:   PONV Risk Score and Plan: Dexamethasone, Ondansetron, Midazolam and Treatment may vary due to age or medical condition  Airway Management Planned:   Additional Equipment:   Intra-op Plan:   Post-operative Plan:   Informed Consent: I have reviewed the patients History and Physical, chart, labs and discussed the procedure including the risks, benefits and alternatives for the proposed anesthesia with the  patient or authorized representative who has indicated his/her understanding and acceptance.     Dental Advisory Given  Plan Discussed with: Anesthesiologist and CRNA  Anesthesia Plan Comments:         Anesthesia Quick Evaluation

## 2018-12-04 NOTE — Op Note (Signed)
Operative Note  12/04/2018  PRE-OP DIAGNOSIS: Thickened Endometrium, Abnormal uterine bleeding  POST-OP DIAGNOSIS: same   SURGEON: Keano Guggenheim MD  PROCEDURE: Procedure(s): DILATATION & CURETTAGE/HYSTEROSCOPY   ANESTHESIA: Choice   ESTIMATED BLOOD LOSS: 25cc   SPECIMENS:  Endometrium curettings  COMPLICATIONS: None  DISPOSITION: PACU - hemodynamically stable.  CONDITION: stable  FINDINGS: Exam under anesthesia revealed small, mobile 9cm uterus with no masses and bilateral adnexa without masses or fullness. Hysteroscopy revealed a  grossly normal appearing uterine cavity with bilateral tubal ostia and normal appearing endocervical canal.  PROCEDURE IN DETAIL: After informed consent was obtained, the patient was taken to the operating room where anesthesia was obtained without difficulty. The patient was positioned in the dorsal lithotomy position in [place]. The patient's bladder was catheterized with an in and out foley catheter. The patient was examined under anesthesia, with the above noted findings. The right angle retractot was placed inside the patient's vagina, and the the anterior lip of the cervix was seen and grasped with the tenaculum.  The uterine cavity was sounded to 9 cm, and then the cervix was progressively dilated to a 18French-Pratt dilator. The 30 degree hysteroscope was introduced, with saline fluid used to distend the intrauterine cavity, with the above noted findings.  The hystersocope was removed and the uterine cavity was curetted until a gritty texture was noted, yielding endometrial curettings. The hysteroscopy was repeated showing a full smapling of all of the uterine walls.  Excellent hemostasis was noted, and all instruments were removed, with excellent hemostasis noted throughout the case. She was then taken out of dorsal lithotomy. Minimal discrepancy in fluid was noted.  The patient tolerated the procedure well. Sponge, lap and needle counts  were correct x2. The patient was taken to recovery room in excellent condition.  Adrian Prows, MD [facility], [facility] 12/04/2018  10:38 AM

## 2018-12-04 NOTE — Anesthesia Post-op Follow-up Note (Signed)
Anesthesia QCDR form completed.        

## 2018-12-04 NOTE — Discharge Instructions (Signed)
Hysteroscopy, Care After This sheet gives you information about how to care for yourself after your procedure. Your health care provider may also give you more specific instructions. If you have problems or questions, contact your health care provider. What can I expect after the procedure? After the procedure, it is common to have:  Cramping.  Bleeding. This can vary from light spotting to menstrual-like bleeding. Follow these instructions at home: Activity  Rest for 1-2 days after the procedure.  Do not douche, use tampons, or have sex for 2 weeks after the procedure, or until your health care provider approves.  Do not drive for 24 hours after the procedure, or for as long as told by your health care provider.  Do not drive, use heavy machinery, or drink alcohol while taking prescription pain medicines. Medicines   Take over-the-counter and prescription medicines only as told by your health care provider.  Do not take aspirin during recovery. It can increase the risk of bleeding. General instructions  Do not take baths, swim, or use a hot tub until your health care provider approves. Take showers instead of baths for 2 weeks, or for as long as told by your health care provider.  To prevent or treat constipation while you are taking prescription pain medicine, your health care provider may recommend that you: ? Drink enough fluid to keep your urine clear or pale yellow. ? Take over-the-counter or prescription medicines. ? Eat foods that are high in fiber, such as fresh fruits and vegetables, whole grains, and beans. ? Limit foods that are high in fat and processed sugars, such as fried and sweet foods.  Keep all follow-up visits as told by your health care provider. This is important. Contact a health care provider if:  You feel dizzy or lightheaded.  You feel nauseous.  You have abnormal vaginal discharge.  You have a rash.  You have pain that does not get better with  medicine.  You have chills. Get help right away if:  You have bleeding that is heavier than a normal menstrual period.  You have a fever.  You have pain or cramps that get worse.  You develop new abdominal pain.  You faint.  You have pain in your shoulders.  You have shortness of breath. Summary  After the procedure, you may have cramping and some vaginal bleeding.  Do not douche, use tampons, or have sex for 2 weeks after the procedure, or until your health care provider approves.  Do not take baths, swim, or use a hot tub until your health care provider approves. Take showers instead of baths for 2 weeks, or for as long as told by your health care provider.  Report any unusual symptoms to your health care provider.  Keep all follow-up visits as told by your health care provider. This is important. This information is not intended to replace advice given to you by your health care provider. Make sure you discuss any questions you have with your health care provider. Document Released: 08/14/2013 Document Revised: 11/22/2016 Document Reviewed: 11/22/2016 Elsevier Interactive Patient Education  2019 [place]   1) The drugs that you were given will stay in your system until tomorrow so for the next 24 hours you should not:  A) Drive an automobile B) Make any legal decisions C) Drink any alcoholic beverage   2) You may resume regular meals tomorrow.  Today it is better to start with liquids and gradually work  up to solid foods.  You may eat anything you prefer, but it is better to start with liquids, then soup and crackers, and gradually work up to solid foods.   3) Please notify your doctor immediately if you have any unusual bleeding, trouble breathing, redness and pain at the surgery site, drainage, fever, or pain not relieved by medication.    4) Additional Instructions:        Please contact your physician  with any problems or Same Day Surgery at [phone], Monday through Friday 6 am to 4 pm, or [place] at [facility] number at [phone].

## 2018-12-04 NOTE — Anesthesia Procedure Notes (Signed)
Procedure Name: LMA Insertion Date/Time: 12/04/2018 9:26 AM Performed by: Geraldine Contras, CRNA Pre-anesthesia Checklist: Patient identified, Emergency Drugs available, Suction available, Patient being monitored and Timeout performed Patient Re-evaluated:Patient Re-evaluated prior to induction Oxygen Delivery Method: Circle system utilized Preoxygenation: Pre-oxygenation with 100% oxygen Induction Type: IV induction LMA: LMA inserted LMA Size: 3.5 Number of attempts: 1 Placement Confirmation: positive ETCO2 Dental Injury: Teeth and Oropharynx as per pre-operative assessment

## 2018-12-04 NOTE — Interval H&P Note (Signed)
History and Physical Interval Note:  12/04/2018 9:04 AM  Kathryn Harding  has presented today for surgery, with the diagnosis of ABNORMAL UTERINE BLEEDING, THICKENED ENDOMETRIUM  The various methods of treatment have been discussed with the patient and family. After consideration of risks, benefits and other options for treatment, the patient has consented to  Procedure(s): DILATATION & CURETTAGE/HYSTEROSCOPY (N/A) as a surgical intervention .  The patient's history has been reviewed, patient examined, no change in status, stable for surgery.  I have reviewed the patient's chart and labs.  Questions were answered to the patient's satisfaction.     [employer]

## 2018-12-04 NOTE — Anesthesia Postprocedure Evaluation (Signed)
Anesthesia Post Note  Patient: Kathryn Harding  Procedure(s) Performed: DILATATION & CURETTAGE/HYSTEROSCOPY (N/A )  Patient location during evaluation: PACU Anesthesia Type: General Level of consciousness: awake and alert Pain management: pain level controlled Vital Signs Assessment: post-procedure vital signs reviewed and stable Respiratory status: spontaneous breathing, nonlabored ventilation, respiratory function stable and patient connected to nasal cannula oxygen Cardiovascular status: blood pressure returned to baseline and stable Postop Assessment: no apparent nausea or vomiting Anesthetic complications: no     Last Vitals:  Vitals:   12/04/18 1106 12/04/18 1148  BP: 120/73 118/80  Pulse: (!) 51 60  Resp: 18 18  Temp: (!) 36.2 C   SpO2: 100% 100%    Last Pain:  Vitals:   12/04/18 1106  TempSrc: Temporal  PainSc: 0-No pain                 Durenda Hurt

## 2018-12-05 ENCOUNTER — Encounter: Payer: Self-pay | Admitting: Obstetrics and Gynecology

## 2018-12-06 LAB — SURGICAL PATHOLOGY

## 2018-12-07 NOTE — Progress Notes (Signed)
Called and discussed with patient, normal, released to [employer]

## 2018-12-10 ENCOUNTER — Encounter: Payer: Self-pay | Admitting: Obstetrics and Gynecology

## 2018-12-10 ENCOUNTER — Ambulatory Visit (INDEPENDENT_AMBULATORY_CARE_PROVIDER_SITE_OTHER): Payer: 59 | Admitting: Obstetrics and Gynecology

## 2018-12-10 VITALS — BP 120/70 | Ht 65.0 in | Wt 223.0 lb

## 2018-12-10 DIAGNOSIS — E282 Polycystic ovarian syndrome: Secondary | ICD-10-CM

## 2018-12-10 DIAGNOSIS — Z9889 Other specified postprocedural states: Secondary | ICD-10-CM

## 2018-12-10 MED ORDER — MEDROXYPROGESTERONE ACETATE 10 MG PO TABS: 10.0000 mg | ORAL_TABLET | Freq: Every day | ORAL | 4 refills | Status: DC

## 2018-12-10 MED ORDER — METFORMIN HCL 500 MG PO TABS: ORAL_TABLET | ORAL | 0 refills | Status: DC

## 2018-12-10 NOTE — Progress Notes (Signed)
  Postoperative Follow-up Patient presents post op from diagnostic hysteroscopy for abnormal uterine bleeding, 1 week ago.  Subjective: Patient reports marked improvement in her preop symptoms. Eating a regular diet without difficulty. The patient is not having any pain.  Activity: normal activities of daily living. Patient reports additional symptom's since surgery of None.  Objective: BP 120/70   Ht 5\' 5"  (1.651 m)   Wt 223 lb (101.2 kg)   BMI 37.11 kg/m  Physical Exam Constitutional:      Appearance: She is well-developed.  Genitourinary:     Vagina and uterus normal.     No lesions in the vagina.     No cervical motion tenderness.     No right or left adnexal mass present.  HENT:     Head: Normocephalic and atraumatic.  Neck:     Musculoskeletal: Neck supple.     Thyroid: No thyromegaly.  Cardiovascular:     Rate and Rhythm: Normal rate and regular rhythm.     Heart sounds: Normal heart sounds.  Pulmonary:     Effort: Pulmonary effort is normal.     Breath sounds: Normal breath sounds.  Chest:     Breasts:        Right: No inverted nipple, mass, nipple discharge or skin change.        Left: No inverted nipple, mass, nipple discharge or skin change.  Abdominal:     General: Bowel sounds are normal. There is no distension.     Palpations: Abdomen is soft. There is no mass.  Neurological:     Mental Status: She is alert and oriented to person, place, and time.  Skin:    General: Skin is warm and dry.  Psychiatric:        Behavior: Behavior normal.        Thought Content: Thought content normal.        Judgment: Judgment normal.  Vitals signs reviewed.    Assessment: s/p :  diagnostic hysteroscopy stable  Plan: Patient has done well after surgery with no apparent complications.  I have discussed the post-operative course to date, and the expected progress moving forward.  The patient understands what complications to be concerned about.  I will see the patient in  routine follow up, or sooner if needed.    Activity plan: No restriction.  Referral to REI made  Discussed PCOS, will treat irregular heavy periods with monthly provera in preparation for future fertility appointments.  Will start on metformin as well.   Wynette Jersey R Earnstine Meinders 12/10/2018, 12:01 PM

## 2018-12-14 ENCOUNTER — Ambulatory Visit: Payer: 59 | Admitting: Cardiovascular Disease

## 2019-02-12 ENCOUNTER — Encounter: Payer: Self-pay | Admitting: Emergency Medicine

## 2019-02-12 ENCOUNTER — Emergency Department: Payer: 59

## 2019-02-12 ENCOUNTER — Other Ambulatory Visit: Payer: Self-pay

## 2019-02-12 ENCOUNTER — Emergency Department
Admission: EM | Admit: 2019-02-12 | Discharge: 2019-02-12 | Disposition: A | Discharge status: Home / Self Care | Payer: 59 | Attending: Emergency Medicine | Admitting: Emergency Medicine

## 2019-02-12 DIAGNOSIS — Z79899 Other long term (current) drug therapy: Secondary | ICD-10-CM | POA: Diagnosis not present

## 2019-02-12 DIAGNOSIS — M436 Torticollis: Secondary | ICD-10-CM | POA: Diagnosis not present

## 2019-02-12 DIAGNOSIS — M542 Cervicalgia: Secondary | ICD-10-CM | POA: Diagnosis present

## 2019-02-12 DIAGNOSIS — Z7984 Long term (current) use of oral hypoglycemic drugs: Secondary | ICD-10-CM | POA: Diagnosis not present

## 2019-02-12 MED ORDER — KETOROLAC TROMETHAMINE 30 MG/ML IJ SOLN
30.0000 mg | Freq: Once | INTRAMUSCULAR | Status: AC
  Administered 2019-02-12: 30 mg via INTRAVENOUS
  Filled 2019-02-12: qty 1

## 2019-02-12 MED ORDER — CYCLOBENZAPRINE HCL 10 MG PO TABS: 10.0000 mg | ORAL_TABLET | Freq: Three times a day (TID) | ORAL | 0 refills | Status: DC | PRN

## 2019-02-12 MED ORDER — DIAZEPAM 5 MG/ML IJ SOLN
5.0000 mg | Freq: Once | INTRAMUSCULAR | Status: AC
  Administered 2019-02-12: 5 mg via INTRAVENOUS
  Filled 2019-02-12: qty 2

## 2019-02-12 NOTE — ED Triage Notes (Signed)
Pt arrived to the ED via EMS from home for complaints of neck pain. Pt reports that she went to the bathroom and heard a "pop in her neck and started hurting since. Pt denies injury and or any history of injury. Pt is AOx4 in no apparent distress.

## 2019-02-12 NOTE — ED Notes (Signed)
Pt went to CT

## 2019-02-12 NOTE — ED Provider Notes (Signed)
[facility] Emergency Department Provider Note   First MD Initiated Contact with Patient 02/12/19 [phone]     (approximate)  I have reviewed the triage vital signs and the nursing notes.   HISTORY  Chief Complaint Neck Pain   HPI Kathryn Harding is a 38 y.o. female with medical history as listed below presents via EMS with 1 day history of nontraumatic neck pain.  Patient states that yesterday she had neck discomfort which she thought was "a crick in my neck".  Patient states that she applied Biofreeze to the area with some improvement.  Patient states that she went to bed and awakened and this morning she felt a pop in her neck and subsequent pain that is worsened with any movement of the neck.  Patient denies any trauma.  Patient denies any fever no headache no weakness or numbness.  Past Medical History:  Diagnosis Date   Aphasia 12/10/2011   Bell's palsy    Bell's palsy    Lt side of face   CVA (cerebral infarction)    Gallbladder pain    Heavy menses    TIA (transient ischemic attack) 12/10/2011   TIA (transient ischemic attack)     Patient Active Problem List   Diagnosis Date Noted   Symptomatic cholelithiasis    Aphasia 12/10/2011   TIA (transient ischemic attack) 12/10/2011    Past Surgical History:  Procedure Laterality Date   CHOLECYSTECTOMY N/A 10/11/2017   Procedure: LAPAROSCOPIC CHOLECYSTECTOMY;  Surgeon: Olean Ree, MD;  Location: [facility] ORS;  Service: General;  Laterality: N/A;   DILATATION & CURETTAGE/HYSTEROSCOPY WITH MYOSURE N/A 12/04/2018   Procedure: DILATATION & CURETTAGE/HYSTEROSCOPY;  Surgeon: Homero Fellers, MD;  Location: [facility] ORS;  Service: Gynecology;  Laterality: N/A;   KNEE SURGERY     OVARIAN CYST SURGERY      Prior to Admission medications   Medication Sig Start Date End Date Taking? Authorizing Provider  cyclobenzaprine (FLEXERIL) 10 MG tablet Take 1 tablet (10 mg total) by mouth 3 (three)  times daily as needed. 02/12/19   Gregor Hams, MD  medroxyPROGESTERone (PROVERA) 10 MG tablet Take 1 tablet (10 mg total) by mouth daily. Use for ten days 12/10/18   Homero Fellers, MD  metFORMIN (GLUCOPHAGE) 500 MG tablet Take one tablet by mouth daily for one week. Then increase to one tablet twice a day for one week.  Then two tablets twice a day. 12/10/18   Homero Fellers, MD    Allergies Sulfur  Family History  Problem Relation Age of Onset   Hypertension Mother    Healthy Father    Liver cancer Paternal Uncle    Liver cancer Paternal Uncle     Social History Social History   Tobacco Use   Smoking status: Never Smoker   Smokeless tobacco: Never Used  Substance Use Topics   Alcohol use: Yes    Alcohol/week: 2.0 standard drinks    Types: 2 Cans of beer per week   Drug use: No    Review of Systems Constitutional: No fever/chills Eyes: No visual changes. ENT: No sore throat. Cardiovascular: Denies chest pain. Respiratory: Denies shortness of breath. Gastrointestinal: No abdominal pain.  No nausea, no vomiting.  No diarrhea.  No constipation. Genitourinary: Negative for dysuria. Musculoskeletal: Positive for neck pain.  Negative for back pain. Integumentary: Negative for rash. Neurological: Negative for headaches, focal weakness or numbness.   ____________________________________________   PHYSICAL EXAM:  VITAL SIGNS: ED Triage Vitals  Enc Vitals Group     BP 02/12/19 0613 (!) 118/107     Pulse Rate 02/12/19 0613 (!) 59     Resp 02/12/19 0613 18     Temp 02/12/19 0613 98 F (36.7 C)     Temp Source 02/12/19 0613 Oral     SpO2 02/12/19 0613 100 %     Weight 02/12/19 0615 111.6 kg (246 lb)     Height 02/12/19 0615 1.651 m (5\' 5" )     Head Circumference --      Peak Flow --      Pain Score 02/12/19 0615 9     Pain Loc --      Pain Edu? --      Excl. in [place]? --     Constitutional: Alert and oriented.  Apparent discomfort Eyes:  Conjunctivae are normal. PERRL. EOMI. Mouth/Throat: Mucous membranes are moist.Oropharynx non-erythematous. Neck: No stridor.  No midline tenderness with palpation of the cervical spine.  Pain with cervical portion of the trapezius muscle bilaterally Cardiovascular: Normal rate, regular rhythm. Good peripheral circulation. Grossly normal heart sounds. Respiratory: Normal respiratory effort.  No retractions. Lungs CTAB. Gastrointestinal: Soft and nontender. No distention.  Musculoskeletal: No lower extremity tenderness nor edema. No gross deformities of extremities. Neurologic:  Normal speech and language. No gross focal neurologic deficits are appreciated.  Skin:  Skin is warm, dry and intact. No rash noted. Psychiatric: Mood and affect are normal. Speech and behavior are normal.  ___________________________________  RADIOLOGY I, [place] Marge Vandermeulen, personally viewed and evaluated these images (plain radiographs) as part of my medical decision making, as well as reviewing the written report by the radiologist.  ED MD interpretation: Negative CT cervical spine per radiologist  Official radiology report(s): Ct Cervical Spine Wo Contrast  Result Date: 02/12/2019 CLINICAL DATA:  Neck pain, initial exam EXAM: CT CERVICAL SPINE WITHOUT CONTRAST TECHNIQUE: Multidetector CT imaging of the cervical spine was performed without intravenous contrast. Multiplanar CT image reconstructions were also generated. COMPARISON:  None. FINDINGS: Alignment: Normal Skull base and vertebrae: Negative for fracture or lesion. Soft tissues and spinal canal: No prevertebral fluid or swelling. No visible canal hematoma. Disc levels:  Negative Upper chest: Negative IMPRESSION: Negative cervical spine CT. Electronically Signed   By: Monte Fantasia M.D.   On: 02/12/2019 06:52      Procedures   ____________________________________________   INITIAL IMPRESSION / MDM / ASSESSMENT AND PLAN / ED COURSE  As part of my  medical decision making, I reviewed the following data within the [place] was evaluated in Emergency Department on 02/12/2019 for the symptoms described in the history of present illness. She was evaluated in the context of the global COVID-19 pandemic, which necessitated consideration that the patient might be at risk for infection with the SARS-CoV-2 virus that causes COVID-19. Institutional protocols and algorithms that pertain to the evaluation of patients at risk for COVID-19 are in a state of rapid change based on information released by regulatory bodies including the CDC and federal and state organizations. These policies and algorithms were followed during the patient's care in the ED.      38 year old female presenting with above-stated history and physical exam consistent with cute torticollis.  However considered possibility of intervertebral disc pathology.  Patient has no upper extremity radiation of pain or weakness numbness.  CT cervical spine revealed no acute abnormality.  Patient was given IV Valium 5 mg and IV Toradol 30 mg  on reevaluation patient's pain "much better".  Patient will be discharged home with Flexeril. ____________________________________________  FINAL CLINICAL IMPRESSION(S) / ED DIAGNOSES  Final diagnoses:  Torticollis, acute     MEDICATIONS GIVEN DURING THIS VISIT:  Medications  ketorolac (TORADOL) 30 MG/ML injection 30 mg (30 mg Intravenous Given 02/12/19 0624)  diazepam (VALIUM) injection 5 mg (5 mg Intravenous Given 02/12/19 0626)     ED Discharge Orders         Ordered    cyclobenzaprine (FLEXERIL) 10 MG tablet  3 times daily PRN     02/12/19 6116           Note:  This document was prepared using Dragon voice recognition software and may include unintentional dictation errors.   Gregor Hams, MD 02/12/19 2200

## 2019-07-02 ENCOUNTER — Ambulatory Visit: Payer: 59 | Admitting: Family Medicine

## 2019-07-09 ENCOUNTER — Encounter: Payer: Self-pay | Admitting: Family Medicine

## 2019-07-09 ENCOUNTER — Ambulatory Visit: Payer: 59 | Admitting: Family Medicine

## 2019-07-09 ENCOUNTER — Other Ambulatory Visit: Payer: Self-pay

## 2019-07-09 VITALS — BP 108/72 | HR 63 | Temp 97.9°F | Resp 16 | Ht 64.0 in | Wt 224.8 lb

## 2019-07-09 DIAGNOSIS — E559 Vitamin D deficiency, unspecified: Secondary | ICD-10-CM

## 2019-07-09 DIAGNOSIS — Z6838 Body mass index (BMI) 38.0-38.9, adult: Secondary | ICD-10-CM

## 2019-07-09 DIAGNOSIS — R21 Rash and other nonspecific skin eruption: Secondary | ICD-10-CM | POA: Diagnosis not present

## 2019-07-09 NOTE — Progress Notes (Signed)
Subjective:    Patient ID: Kathryn Harding, female    DOB: Aug 14, 1981, 38 y.o.   MRN: [id number]  HPI   Patient presents to clinic to establish with PCP.  Also has a rash on abdomen under abdominal fold, it is itchy and dry at times.  Denies any new lotions, soaps, detergents.  Has not tried putting anything on the rash over-the-counter to see if it would help.  Rash area is not blistered, does not have any open or draining areas.  No fever or chills.  Patient's past medical, surgical, family and social history reviewed and updated accordingly in chart.  Patient is interested in getting lab work for general screening.   Patient Active Problem List   Diagnosis Date Noted  . Symptomatic cholelithiasis   . Aphasia 12/10/2011  . TIA (transient ischemic attack) 12/10/2011   Social History   Tobacco Use  . Smoking status: Never Smoker  . Smokeless tobacco: Never Used  Substance Use Topics  . Alcohol use: Yes    Alcohol/week: 2.0 standard drinks    Types: 2 Cans of beer per week   Past Surgical History:  Procedure Laterality Date  . CHOLECYSTECTOMY N/A 10/11/2017   Procedure: LAPAROSCOPIC CHOLECYSTECTOMY;  Surgeon: Olean Ree, MD;  Location: [facility] ORS;  Service: General;  Laterality: N/A;  . DILATATION & CURETTAGE/HYSTEROSCOPY WITH MYOSURE N/A 12/04/2018   Procedure: DILATATION & CURETTAGE/HYSTEROSCOPY;  Surgeon: Homero Fellers, MD;  Location: [facility] ORS;  Service: Gynecology;  Laterality: N/A;  . KNEE SURGERY    . OVARIAN CYST SURGERY     Family History  Problem Relation Age of Onset  . Hypertension Mother   . Healthy Father   . Liver cancer Paternal Uncle   . Liver cancer Paternal Uncle      Review of Systems  Constitutional: Negative for chills, fatigue and fever.  HENT: Negative for congestion, ear pain, sinus pain and sore throat.   Eyes: Negative.   Respiratory: Negative for cough, shortness of breath and wheezing.   Cardiovascular: Negative for chest pain,  palpitations and leg swelling.  Gastrointestinal: Negative for abdominal pain, diarrhea, nausea and vomiting.  Genitourinary: Negative for dysuria, frequency and urgency.  Musculoskeletal: Negative for arthralgias and myalgias.  Skin: rash under ABD fold  Neurological: Negative for syncope, light-headedness and headaches.  Psychiatric/Behavioral: The patient is not nervous/anxious.       Objective:   Physical Exam Vitals signs and nursing note reviewed.  Constitutional:      General: She is not in acute distress.    Appearance: She is obese. She is not ill-appearing, toxic-appearing or diaphoretic.  HENT:     Head: Normocephalic and atraumatic.  Eyes:     General: No scleral icterus.    Extraocular Movements: Extraocular movements intact.     Conjunctiva/sclera: Conjunctivae normal.     Pupils: Pupils are equal, round, and reactive to light.  Cardiovascular:     Rate and Rhythm: Normal rate and regular rhythm.     Heart sounds: Normal heart sounds.  Pulmonary:     Effort: Pulmonary effort is normal. No respiratory distress.     Breath sounds: Normal breath sounds.  Skin:    General: Skin is warm and dry.     Findings: Rash present.          Comments: Small rash patch location indicated by red mark on diagram.  Rash appears slightly red and raised, looks like maculopapular rash; surrounding skin in the area does appear a  little dry and flaking.  No blisters, pustules or open areas.  Neurological:     General: No focal deficit present.     Mental Status: She is alert.     Gait: Gait normal.  Psychiatric:        Mood and Affect: Mood normal.        Behavior: Behavior normal.       Today's Vitals   07/09/19 0911  BP: 108/72  Pulse: 63  Resp: 16  Temp: 97.9 F (36.6 C)  TempSrc: Oral  SpO2: 98%  Weight: 224 lb 12.8 oz (102 kg)  Height: 5' 4"  (1.626 m)   Body mass index is 38.59 kg/m.     Assessment & Plan:    1. Rash and nonspecific skin eruption  Rash  does not appear extremely irritated or inflamed at the moment, advised to try topical over-the-counter moisturizing cream from the brand CeraVe day to see if this helps calm the rash down.  If not improving, then we can try some sort of steroid.  - CBC w/Diff - Comp Met (CMET) - TSH - B12 and Folate Panel - VITAMIN D 25 Hydroxy (Vit-D Deficiency, Fractures)  2. BMI 38.0-38.9,adult  Patient's BMI puts her in the obese category.  We will get blood work for screening purposes to look at CBC, CMP, thyroid, B12, vitamin D lipid panel and A1c to rule out any other metabolic, endocrine or vitamin deficiencies.  Reviewed healthy diet and regular physical activity.  Recommended diet high in lean proteins, lots of vegetables, lots of water and at least 30 minutes of some sort of physical activity 5 days/week.  Discussed physical activity could be walking, lifting small weights, doing aerobics like dancing/riding a bike  - Comp Met (CMET) - TSH - Lipid panel - Hemoglobin A1c   We will base next need for follow-up off of any abnormal lab results.  If all lab results come back table and patient's rash resolves, advised she can return annually for physical exam and whenever issues arise.

## 2019-07-09 NOTE — Patient Instructions (Signed)
Try CeraVe moisturizing body cream on rash area, comes in a tub that [place] usually blue/white in color

## 2019-07-10 LAB — CBC WITH DIFFERENTIAL/PLATELET
Basophils Absolute: 0 10*3/uL (ref 0.0–0.2)
Basos: 1 %
EOS (ABSOLUTE): 0.1 10*3/uL (ref 0.0–0.4)
Eos: 2 %
Hematocrit: 41.4 % (ref 34.0–46.6)
Hemoglobin: 13.3 g/dL (ref 11.1–15.9)
Immature Grans (Abs): 0 10*3/uL (ref 0.0–0.1)
Immature Granulocytes: 0 %
Lymphocytes Absolute: 2.1 10*3/uL (ref 0.7–3.1)
Lymphs: 29 %
MCH: 28.9 pg (ref 26.6–33.0)
MCHC: 32.1 g/dL (ref 31.5–35.7)
MCV: 90 fL (ref 79–97)
Monocytes Absolute: 0.6 10*3/uL (ref 0.1–0.9)
Monocytes: 8 %
Neutrophils Absolute: 4.5 10*3/uL (ref 1.4–7.0)
Neutrophils: 60 %
Platelets: 301 10*3/uL (ref 150–450)
RBC: 4.6 x10E6/uL (ref 3.77–5.28)
RDW: 13.2 % (ref 11.7–15.4)
WBC: 7.4 10*3/uL (ref 3.4–10.8)

## 2019-07-10 LAB — COMPREHENSIVE METABOLIC PANEL
ALT: 10 IU/L (ref 0–32)
AST: 19 IU/L (ref 0–40)
Albumin/Globulin Ratio: 1.3 (ref 1.2–2.2)
Albumin: 4.4 g/dL (ref 3.8–4.8)
Alkaline Phosphatase: 84 IU/L (ref 39–117)
BUN/Creatinine Ratio: 21 (ref 9–23)
BUN: 16 mg/dL (ref 6–20)
Bilirubin Total: 0.2 mg/dL (ref 0.0–1.2)
CO2: 21 mmol/L (ref 20–29)
Calcium: 9.7 mg/dL (ref 8.7–10.2)
Chloride: 104 mmol/L (ref 96–106)
Creatinine, Ser: 0.77 mg/dL (ref 0.57–1.00)
GFR calc Af Amer: 113 mL/min/{1.73_m2} (ref >59)
GFR calc non Af Amer: 98 mL/min/{1.73_m2} (ref >59)
Globulin, Total: 3.5 g/dL (ref 1.5–4.5)
Glucose: 103 mg/dL — ABNORMAL HIGH (ref 65–99)
Potassium: 4.9 mmol/L (ref 3.5–5.2)
Sodium: 140 mmol/L (ref 134–144)
Total Protein: 7.9 g/dL (ref 6.0–8.5)

## 2019-07-10 LAB — HEMOGLOBIN A1C
Est. average glucose Bld gHb Est-mCnc: 108 mg/dL
Hgb A1c MFr Bld: 5.4 % (ref 4.8–5.6)

## 2019-07-10 LAB — LIPID PANEL
Chol/HDL Ratio: 3 ratio (ref 0.0–4.4)
Cholesterol, Total: 152 mg/dL (ref 100–199)
HDL: 51 mg/dL (ref >39)
LDL Chol Calc (NIH): 85 mg/dL (ref 0–99)
Triglycerides: 82 mg/dL (ref 0–149)
VLDL Cholesterol Cal: 16 mg/dL (ref 5–40)

## 2019-07-10 LAB — B12 AND FOLATE PANEL
Folate: 7.2 ng/mL (ref >3.0)
Vitamin B-12: 412 pg/mL (ref 232–1245)

## 2019-07-10 LAB — VITAMIN D 25 HYDROXY (VIT D DEFICIENCY, FRACTURES): Vit D, 25-Hydroxy: 12.9 ng/mL — ABNORMAL LOW (ref 30.0–100.0)

## 2019-07-10 LAB — TSH: TSH: 1.99 u[IU]/mL (ref 0.450–4.500)

## 2019-07-12 MED ORDER — VITAMIN D (ERGOCALCIFEROL) 1.25 MG (50000 UNIT) PO CAPS: 50000.0000 [IU] | ORAL_CAPSULE | ORAL | 0 refills | Status: DC

## 2019-07-12 NOTE — Addendum Note (Signed)
Addended by: Philis Nettle on: 07/12/2019 11:52 AM   Modules accepted: Orders

## 2019-07-19 ENCOUNTER — Encounter: Payer: Self-pay | Admitting: Lab

## 2019-07-22 ENCOUNTER — Encounter: Payer: Self-pay | Admitting: Family Medicine

## 2019-07-22 DIAGNOSIS — R21 Rash and other nonspecific skin eruption: Secondary | ICD-10-CM

## 2019-07-22 MED ORDER — TRIAMCINOLONE ACETONIDE 0.1 % EX CREA: 1.0000 "application " | TOPICAL_CREAM | Freq: Two times a day (BID) | CUTANEOUS | 0 refills | Status: AC

## 2019-07-26 ENCOUNTER — Encounter: Payer: Self-pay | Admitting: Family Medicine

## 2019-07-26 DIAGNOSIS — R21 Rash and other nonspecific skin eruption: Secondary | ICD-10-CM

## 2019-07-26 MED ORDER — FEXOFENADINE HCL 180 MG PO TABS: ORAL_TABLET | ORAL | 1 refills | Status: DC

## 2019-09-12 ENCOUNTER — Telehealth: Payer: Self-pay

## 2019-09-12 NOTE — Telephone Encounter (Signed)
Copied from [place] [phone]. Topic: General - Other >> Sep 12, 2019 12:10 PM Celene Kras A wrote: Reason for CRM: Thayer Headings, with [employer], called stating she has faxed over Medical evaluation form on 09/11/2019. She is requesting to know if it has been received. Please advise. [phone]

## 2019-09-12 NOTE — Telephone Encounter (Signed)
I spoke with Thayer Headings & let her know that fax was received. She asked that Lauren print her name & initial the correction on form. She needed faxed back asap. Lauren has corrected & I have faxed back.

## 2020-03-03 ENCOUNTER — Ambulatory Visit: Payer: 59 | Admitting: Nurse Practitioner

## 2020-03-03 ENCOUNTER — Encounter: Payer: Self-pay | Admitting: Nurse Practitioner

## 2020-03-03 ENCOUNTER — Other Ambulatory Visit: Payer: Self-pay

## 2020-03-03 VITALS — BP 120/78 | HR 60 | Temp 97.3°F | Ht 64.0 in | Wt 229.8 lb

## 2020-03-03 DIAGNOSIS — R300 Dysuria: Secondary | ICD-10-CM

## 2020-03-03 DIAGNOSIS — R350 Frequency of micturition: Secondary | ICD-10-CM | POA: Diagnosis not present

## 2020-03-03 LAB — POCT URINALYSIS DIPSTICK
Bilirubin, UA: NEGATIVE
Blood, UA: NEGATIVE
Glucose, UA: NEGATIVE
Ketones, UA: NEGATIVE
Leukocytes, UA: NEGATIVE
Nitrite, UA: NEGATIVE
Protein, UA: NEGATIVE
Spec Grav, UA: 1.025 (ref 1.010–1.025)
Urobilinogen, UA: 0.2 E.U./dL
pH, UA: 6 (ref 5.0–8.0)

## 2020-03-03 LAB — URINALYSIS, MICROSCOPIC ONLY
RBC / HPF: NONE SEEN (ref 0–?)
WBC, UA: NONE SEEN (ref 0–?)

## 2020-03-03 NOTE — Patient Instructions (Addendum)
It was nice to meet you today in person and [place] over the phone.   Please go to a [facility] facility and get your blood work done.  I will call you with results of the urinalysis when it is available.  Please wean down the daily Pepsi or other soda drinks, even diet. These irritate the bladder and can contribute to an over active bladder. Drink more water, avoid drinking excess in the evening.   Can you keep a urine frequency diary to share with [place]?   Office  visit in 2 weeks to review.

## 2020-03-03 NOTE — Progress Notes (Signed)
Established Patient Office Visit  Subjective:  Patient ID: Kathryn Harding, female    DOB: 1981/09/12  Age: 39 y.o. MRN: [id number]  CC:  Chief Complaint  Patient presents with  . Acute Visit    possible UTI    HPI Kathryn Harding presents for onset 3 weeks ago of random, right flank discomfort described as a mild ache, and last 2 minutes.  She thinks that since she gets this discomfort when she has to void.  Patient has also noticed increased frequency in urination multiple times a day every 20 minutes.  She has nocturia x2.  She experiences bladder pressure, passes a full stream, and no burning pain, hematuria, unusual odor, or vaginal discharge.  She experiences the right flank discomfort randomly throughout the day, and has had no injury or trauma to the back.  She does drink large amount of sugar Pepsi 4 cans/day.  She tries not to drink much after dinner.  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  She will occasionally notice a little dribbling but incontinence is not a problem.  She denies risk of pregnancy as she has a female partner.  Last menstrual period was March and it only lasted a couple days.  This has been her baseline.  She has never had a  UTI in the past, no history of kidney disease.  She has not seen a urologist in the past.  Her partner,  Debroah Loop is on the cell phone during this visit and would like Kathryn Harding to be tested for diabetes.    Past Medical History:  Diagnosis Date  . Aphasia 12/10/2011  . Bell's palsy   . Bell's palsy    Lt side of face  . CVA (cerebral infarction)   . Gallbladder pain   . Heavy menses   . TIA (transient ischemic attack) 12/10/2011  . TIA (transient ischemic attack)     Past Surgical History:  Procedure Laterality Date  . CHOLECYSTECTOMY N/A 10/11/2017   Procedure: LAPAROSCOPIC CHOLECYSTECTOMY;  Surgeon: Olean Ree, MD;  Location: [facility];  Service: General;  Laterality: N/A;  . DILATATION &  CURETTAGE/HYSTEROSCOPY WITH MYOSURE N/A 12/04/2018   Procedure: DILATATION & CURETTAGE/HYSTEROSCOPY;  Surgeon: Homero Fellers, MD;  Location: [facility] OR[facility];  Service: Gynecology;  Laterality: N/A;  . KNEE SURGERY    . OVARIAN CYST SURGERY      Family History  Problem Relation Age of Onset  . Hypertension Mother   . Healthy Father   . Liver cancer Paternal Uncle   . Liver cancer Paternal Uncle     Social History   Socioeconomic History  . Marital status: Married    Spouse name: Not on file  . Number of children: Not on file  . Years of education: Not on file  . Highest education level: Not on file  Occupational History  . Not on file  Tobacco Use  . Smoking status: Never Smoker  . Smokeless tobacco: Never Used  Substance and Sexual Activity  . Alcohol use: Yes    Alcohol/week: 2.0 standard drinks    Types: 2 Cans of beer per week  . Drug use: No  . Sexual activity: Never    Birth control/protection: None  Other Topics Concern  . Not on file  Social History Narrative  . Not on file   Social Determinants of Health   Financial Resource Strain:   . Difficulty of Paying Living Expenses:  Food Insecurity:   . Worried About Charity fundraiser in the Last Year:   . Arboriculturist in the Last Year:   Transportation Needs:   . Film/video editor (Medical):   Marland Kitchen Lack of Transportation (Non-Medical):   Physical Activity:   . Days of Exercise per Week:   . Minutes of Exercise per Session:   Stress:   . Feeling of Stress :   Social Connections:   . Frequency of Communication with Friends and Family:   . Frequency of Social Gatherings with Friends and Family:   . Attends Religious Services:   . Active Member of Clubs or Organizations:   . Attends Archivist Meetings:   Marland Kitchen Marital Status:   Intimate Partner Violence:   . Fear of Current or Ex-Partner:   . Emotionally Abused:   Marland Kitchen Physically Abused:   . Sexually Abused:     Outpatient Medications Prior  to Visit  Medication Sig Dispense Refill  . fexofenadine (ALLEGRA ALLERGY) 180 MG tablet Take 2 times daily for 1 week, then take 1 time daily. (Patient not taking: Reported on 03/03/2020) 90 tablet 1  . triamcinolone cream (KENALOG) 0.1 % Apply 1 application topically 2 (two) times daily. For 7 days. (Patient not taking: Reported on 03/03/2020) 30 g 0  . Vitamin D, Ergocalciferol, (DRISDOL) 1.25 MG (50000 UT) CAPS capsule Take 1 capsule (50,000 Units total) by mouth every 7 (seven) days. (Patient not taking: Reported on 03/03/2020) 12 capsule 0   No facility-administered medications prior to visit.    Allergies  Allergen Reactions  . Sulfur Other (See Comments)    HEADACHES      Review of Systems  Constitutional: Negative for chills, fatigue and fever.  HENT: Negative for congestion and sore throat.   Eyes: Negative.   Respiratory: Negative.   Cardiovascular: Negative.   Gastrointestinal: Negative for abdominal pain and constipation.       Post prandial loose stool x3 per day after meals since her cholecystectomy   Genitourinary: Positive for flank pain, frequency and vaginal discharge. Negative for decreased urine volume, genital sores, hematuria, menstrual problem, pelvic pain, urgency and vaginal bleeding.  Musculoskeletal: Positive for back pain.  Neurological: Negative.   Hematological: Negative.       Objective:    Physical Exam  Constitutional: She is oriented to person, place, and time. She appears well-developed and well-nourished.  HENT:  Head: Normocephalic and atraumatic.  Cardiovascular: Normal rate, regular rhythm and normal heart sounds.  Pulmonary/Chest: Effort normal and breath sounds normal.  Abdominal: Soft. There is no abdominal tenderness.  Musculoskeletal:        General: Normal range of motion.     Cervical back: Normal range of motion and neck supple.     Comments: Back exam is negative, normal ROM, no tenderness or pain with percussion or palpation.     Neurological: She is alert and oriented to person, place, and time.  Skin: Skin is warm and dry.    BP 120/78 (BP Location: Left Arm, Patient Position: Sitting, Cuff Size: Large)   Pulse 60   Temp (!) 97.3 F (36.3 C) (Skin)   Ht 5\' 4"  (1.626 m)   Wt 229 lb 12.8 oz (104.2 kg)   SpO2 98%   BMI 39.45 kg/m  Wt Readings from Last 3 Encounters:  03/03/20 229 lb 12.8 oz (104.2 kg)  07/09/19 224 lb 12.8 oz (102 kg)  02/12/19 246 lb (111.6 kg)  Health Maintenance Due  Topic Date Due  . COVID-19 Vaccine (1) Never done  . TETANUS/TDAP  Never done    There are no preventive care reminders to display for this patient.  Lab Results  Component Value Date   TSH 1.990 07/09/2019   Lab Results  Component Value Date   WBC 7.4 07/09/2019   HGB 13.3 07/09/2019   HCT 41.4 07/09/2019   MCV 90 07/09/2019   PLT 301 07/09/2019   Lab Results  Component Value Date   NA 140 07/09/2019   K 4.9 07/09/2019   CO2 21 07/09/2019   GLUCOSE 103 (H) 07/09/2019   BUN 16 07/09/2019   CREATININE 0.77 07/09/2019   BILITOT 0.2 07/09/2019   ALKPHOS 84 07/09/2019   AST 19 07/09/2019   ALT 10 07/09/2019   PROT 7.9 07/09/2019   ALBUMIN 4.4 07/09/2019   CALCIUM 9.7 07/09/2019   ANIONGAP 5 11/23/2018   Lab Results  Component Value Date   CHOL 152 07/09/2019   Lab Results  Component Value Date   HDL 51 07/09/2019   Lab Results  Component Value Date   LDLCALC 85 07/09/2019   Lab Results  Component Value Date   TRIG 09/14/1981 07/09/2019   Lab Results  Component Value Date   CHOLHDL 3.0 07/09/2019   Lab Results  Component Value Date   HGBA1C 5.4 07/09/2019      Assessment & Plan:   Problem List Items Addressed This Visit    None    Visit Diagnoses    Urinary frequency    -  Primary   Relevant Orders   Basic Metabolic Panel (BMET)   CBC with Differential/Platelet   Dysuria       Relevant Orders   POCT Urinalysis Dipstick (Completed)   Urine Culture   Urine Microscopic Only      It was nice to meet you today.  Please go to a [facility] facility and get your blood work done.  I will call you with results of the urinalysis when it is available.  Please wean down the daily Pepsi or other soda drinks, even diet. These irritate the bladder and can contribute to an over active bladder. Drink more water, avoid drinking excess in the evening.   Can you keep a urine frequency diary to share with [place]?   Office  visit in 2 weeks to review.  No orders of the defined types were placed in this encounter.   Follow-up: Return in about 2 weeks (around 03/17/2020).    Denice Paradise, NP

## 2020-03-04 LAB — CBC WITH DIFFERENTIAL/PLATELET
Basophils Absolute: 0 10*3/uL (ref 0.0–0.2)
Basos: 0 %
EOS (ABSOLUTE): 0.1 10*3/uL (ref 0.0–0.4)
Eos: 1 %
Hematocrit: 39.7 % (ref 34.0–46.6)
Hemoglobin: 13.4 g/dL (ref 11.1–15.9)
Immature Grans (Abs): 0 10*3/uL (ref 0.0–0.1)
Immature Granulocytes: 0 %
Lymphocytes Absolute: 2.5 10*3/uL (ref 0.7–3.1)
Lymphs: 27 %
MCH: 30 pg (ref 26.6–33.0)
MCHC: 33.8 g/dL (ref 31.5–35.7)
MCV: 89 fL (ref 79–97)
Monocytes Absolute: 0.8 10*3/uL (ref 0.1–0.9)
Monocytes: 9 %
Neutrophils Absolute: 5.8 10*3/uL (ref 1.4–7.0)
Neutrophils: 63 %
Platelets: 294 10*3/uL (ref 150–450)
RBC: 4.47 x10E6/uL (ref 3.77–5.28)
RDW: 13 % (ref 11.7–15.4)
WBC: 9.2 10*3/uL (ref 3.4–10.8)

## 2020-03-04 LAB — BASIC METABOLIC PANEL
BUN/Creatinine Ratio: 19 (ref 9–23)
BUN: 15 mg/dL (ref 6–20)
CO2: 21 mmol/L (ref 20–29)
Calcium: 10 mg/dL (ref 8.7–10.2)
Chloride: 103 mmol/L (ref 96–106)
Creatinine, Ser: 0.78 mg/dL (ref 0.57–1.00)
GFR calc Af Amer: 111 mL/min/{1.73_m2} (ref >59)
GFR calc non Af Amer: 96 mL/min/{1.73_m2} (ref >59)
Glucose: 92 mg/dL (ref 65–99)
Potassium: 4.6 mmol/L (ref 3.5–5.2)
Sodium: 137 mmol/L (ref 134–144)

## 2020-03-04 LAB — URINE CULTURE
MICRO NUMBER:: 10410603
Result:: NO GROWTH
SPECIMEN QUALITY:: ADEQUATE

## 2020-03-13 ENCOUNTER — Other Ambulatory Visit: Payer: Self-pay

## 2020-03-17 ENCOUNTER — Other Ambulatory Visit: Payer: Self-pay

## 2020-03-17 ENCOUNTER — Encounter: Payer: Self-pay | Admitting: Nurse Practitioner

## 2020-03-17 ENCOUNTER — Ambulatory Visit: Payer: 59 | Admitting: Nurse Practitioner

## 2020-03-17 VITALS — BP 122/80 | HR 68 | Temp 97.7°F | Ht 64.0 in | Wt 234.0 lb

## 2020-03-17 DIAGNOSIS — G459 Transient cerebral ischemic attack, unspecified: Secondary | ICD-10-CM

## 2020-03-17 DIAGNOSIS — R0683 Snoring: Secondary | ICD-10-CM | POA: Diagnosis not present

## 2020-03-17 DIAGNOSIS — Z6841 Body Mass Index (BMI) 40.0 and over, adult: Secondary | ICD-10-CM

## 2020-03-17 DIAGNOSIS — R7989 Other specified abnormal findings of blood chemistry: Secondary | ICD-10-CM | POA: Diagnosis not present

## 2020-03-17 DIAGNOSIS — G479 Sleep disorder, unspecified: Secondary | ICD-10-CM | POA: Diagnosis not present

## 2020-03-17 DIAGNOSIS — R4701 Aphasia: Secondary | ICD-10-CM

## 2020-03-17 MED ORDER — FAMOTIDINE 20 MG PO TABS: 20.0000 mg | ORAL_TABLET | Freq: Every day | ORAL | 0 refills | Status: DC

## 2020-03-17 NOTE — Progress Notes (Signed)
Established Patient Office Visit  Subjective:  Patient ID: Kathryn Harding, female    DOB: Dec 04, 1980  Age: 39 y.o. MRN: [id number] Already have a times a day could you keep a little diary CC:  Chief Complaint  Patient presents with  . Follow-up    HPI Kathryn Harding is a 39 year old patient with a history of TIA, aphasia previously followed by [facility]. She was seen on 03/03/2020 for urinary frequency, nonspecific back pain.  Urine culture was negative.  She was advised to discontinue her heavy Pepsi carbonated soda use. Her labs returned within normal range.  We did not see evidence of diabetes.  Today, she reports doing much better.  She still voids more than she thinks she should.  She has voided 3 times since 530 this morning.  She has had about 3 Pepsi's over the last few week.  No nocturia.  No incontinence.  She does have problems with her sleep.  She feels like she wakes up with somebody pulling on her throat when she gasps, coughs and wakes up.  She does have snoring.  Her significant other is on the telephone reports that there has been some concerns about sleep apnea.  She has gained weight.  No obvious heartburn, reflux, indigestion.  Obesity: BMI 5 ft 4 in. Drinking more water and not exercise more-plans to.   TIA/Bell's Palsy caused aphasia-resolved-but words with "s"  and "p" still not normally. If she drinks fast, she will spill liquid.   Low Vit D: 12.9 on 07/09/2019  Past Medical History:  Diagnosis Date  . Aphasia 12/10/2011  . Bell's palsy   . Bell's palsy    Lt side of face  . BMI 40.0-44.9, adult ([place]) 03/18/2020  . CVA (cerebral infarction)   . Gallbladder pain   . Heavy menses   . Low vitamin D level 03/18/2020  . Sleep disorder 03/18/2020  . Snores 03/18/2020  . TIA (transient ischemic attack) 12/10/2011  . TIA (transient ischemic attack)     Past Surgical History:  Procedure Laterality Date  . CHOLECYSTECTOMY N/A 10/11/2017   Procedure:  LAPAROSCOPIC CHOLECYSTECTOMY;  Surgeon: Olean Ree, MD;  Location: [facility];  Service: General;  Laterality: N/A;  . DILATATION & CURETTAGE/HYSTEROSCOPY WITH MYOSURE N/A 12/04/2018   Procedure: DILATATION & CURETTAGE/HYSTEROSCOPY;  Surgeon: Homero Fellers, MD;  Location: [facility];  Service: Gynecology;  Laterality: N/A;  . KNEE SURGERY    . OVARIAN CYST SURGERY      Family History  Problem Relation Age of Onset  . Hypertension Mother   . Healthy Father   . Liver cancer Paternal Uncle   . Liver cancer Paternal Uncle     Social History   Socioeconomic History  . Marital status: Married    Spouse name: Not on file  . Number of children: Not on file  . Years of education: Not on file  . Highest education level: Not on file  Occupational History  . Not on file  Tobacco Use  . Smoking status: Never Smoker  . Smokeless tobacco: Never Used  Substance and Sexual Activity  . Alcohol use: Yes    Alcohol/week: 2.0 standard drinks    Types: 2 Cans of beer per week  . Drug use: No  . Sexual activity: Never    Birth control/protection: None  Other Topics Concern  . Not on file  Social History Narrative  . Not on file   Social Determinants of Health   Financial Resource  Strain:   . Difficulty of Paying Living Expenses:   Food Insecurity:   . Worried About Charity fundraiser in the Last Year:   . Arboriculturist in the Last Year:   Transportation Needs:   . Film/video editor (Medical):   Marland Kitchen Lack of Transportation (Non-Medical):   Physical Activity:   . Days of Exercise per Week:   . Minutes of Exercise per Session:   Stress:   . Feeling of Stress :   Social Connections:   . Frequency of Communication with Friends and Family:   . Frequency of Social Gatherings with Friends and Family:   . Attends Religious Services:   . Active Member of Clubs or Organizations:   . Attends Archivist Meetings:   Marland Kitchen Marital Status:   Intimate Partner Violence:   .  Fear of Current or Ex-Partner:   . Emotionally Abused:   Marland Kitchen Physically Abused:   . Sexually Abused:     Outpatient Medications Prior to Visit  Medication Sig Dispense Refill  . fexofenadine (ALLEGRA ALLERGY) 180 MG tablet Take 2 times daily for 1 week, then take 1 time daily. 90 tablet 1  . triamcinolone cream (KENALOG) 0.1 % Apply 1 application topically 2 (two) times daily. For 7 days. 30 g 0  . Vitamin D, Ergocalciferol, (DRISDOL) 1.25 MG (50000 UT) CAPS capsule Take 1 capsule (50,000 Units total) by mouth every 7 (seven) days. 12 capsule 0   No facility-administered medications prior to visit.    Allergies  Allergen Reactions  . Sulfur Other (See Comments)    HEADACHES     Review of Systems  Constitutional: Negative.   HENT: Negative.   Eyes: Negative.   Cardiovascular: Negative for chest pain, palpitations and leg swelling.  Gastrointestinal: Negative.   Musculoskeletal: Negative.   Neurological: Negative.   Psychiatric/Behavioral: Negative.       Objective:    Physical Exam  Constitutional: She is oriented to person, place, and time. She appears well-developed and well-nourished.  Cardiovascular: Normal rate and regular rhythm.  Pulmonary/Chest: Effort normal and breath sounds normal.  Abdominal: Soft. There is no abdominal tenderness.  Musculoskeletal:        General: Normal range of motion.  Neurological: She is alert and oriented to person, place, and time.  Skin: Skin is warm and dry.  Psychiatric: She has a normal mood and affect. Her behavior is normal.  Vitals reviewed.   BP 122/80 (BP Location: Left Arm, Patient Position: Sitting, Cuff Size: Small)   Pulse 68   Temp 97.7 F (36.5 C) (Skin)   Ht 5\' 4"  (1.626 m)   Wt 234 lb (106.1 kg)   SpO2 97%   BMI 40.17 kg/m  Wt Readings from Last 3 Encounters:  03/17/20 234 lb (106.1 kg)  03/03/20 229 lb 12.8 oz (104.2 kg)  07/09/19 224 lb 12.8 oz (102 kg)     Health Maintenance Due  Topic Date Due  .  COVID-19 Vaccine (1) Never done  . TETANUS/TDAP  Never done    There are no preventive care reminders to display for this patient.  Lab Results  Component Value Date   TSH 1.990 07/09/2019   Lab Results  Component Value Date   WBC 9.2 03/03/2020   HGB 13.4 03/03/2020   HCT 39.7 03/03/2020   MCV 89 03/03/2020   PLT 294 03/03/2020   Lab Results  Component Value Date   NA 137 03/03/2020   K 4.6 03/03/2020  CO2 21 03/03/2020   GLUCOSE 92 03/03/2020   BUN 15 03/03/2020   CREATININE 0.78 03/03/2020   BILITOT 0.2 07/09/2019   ALKPHOS 84 07/09/2019   AST 19 07/09/2019   ALT 10 07/09/2019   PROT 7.9 07/09/2019   ALBUMIN 4.4 07/09/2019   CALCIUM 10.0 03/03/2020   ANIONGAP 5 11/23/2018   Lab Results  Component Value Date   CHOL 152 07/09/2019   Lab Results  Component Value Date   HDL 51 07/09/2019   Lab Results  Component Value Date   LDLCALC 85 07/09/2019   Lab Results  Component Value Date   TRIG Oct 07, 1981 07/09/2019   Lab Results  Component Value Date   CHOLHDL 3.0 07/09/2019   Lab Results  Component Value Date   HGBA1C 5.4 07/09/2019      Assessment & Plan:   Problem List Items Addressed This Visit      Cardiovascular and Mediastinum   TIA (transient ischemic attack)     Other   Aphasia   Low vitamin D level   BMI 40.0-44.9, adult ([place]) - Primary   Relevant Orders   Ambulatory referral to Pulmonology   Snores   Relevant Orders   Ambulatory referral to Pulmonology   Sleep disorder   Relevant Orders   Ambulatory referral to Pulmonology      Meds ordered this encounter  Medications  . famotidine (PEPCID) 20 MG tablet    Sig: Take 1 tablet (20 mg total) by mouth at bedtime.    Dispense:  30 tablet    Refill:  0    Order Specific Question:   Supervising Provider    Answer:   Einar Pheasant [id number]   Patient was advised: I  have ordered a referral to pulmonologist to check you for sleep apnea.  I have ordered a trial of famotidine 20 mg to  take before bedtime to see if this keeps you from waking up with choking sensation in the throat.  If it seems to make a difference and really helps, let me know and I will refill the medication.  If it does not help at all, you may stop it.  You may take over the counter Vit D 3 400  IU daily.  I will check a vitamin D level in September when you come in for your preventative care visit.  Continue to work on weight loss, exercise, improved dietary habits. A healthy lifestyle including appropriate food choices and regular exercise. I recommended a diet that is moderate in fiber with fresh fruits and vegetables and low in saturated fats and simple carbohydrates. I recommended getting exercise daily and vigorous exercise 3 times per week to include a total of at least 150 minutes per week.   A total of 35 minutes of face to face time was spent with patient more than half of which was spent in counseling and coordination of care. Her significant other, was on the phone asking questions and participating.   Follow-up office visit in September for preventative care visit.  Follow-up: Return in about 5 months (around 08/06/2020).   This visit occurred during the SARS-CoV-2 public health emergency.  Safety protocols were in place, including screening questions prior to the visit, additional usage of staff PPE, and extensive cleaning of exam room while observing appropriate contact time as indicated for disinfecting solutions.   Denice Paradise, NP

## 2020-03-17 NOTE — Patient Instructions (Addendum)
It was great to see you again today.   I have ordered a referral to pulmonologist to check you for sleep apnea.  I have ordered a trial of famotidine 20 mg to take before bedtime to see if this keeps you from waking up with choking sensation in the throat.  If it seems to make a difference and really helps, let me know and I will refill the medication.  If it does not help at all, you may stop it.  You may take over the counter Vit D 3 400  IU daily.  I will check a vitamin D level in September when you come in for your preventative care visit.  Continue to work on weight loss, exercise, improved dietary habits. A healthy lifestyle including appropriate food choices and regular exercise. I recommended a diet that is moderate in fiber with fresh fruits and vegetables and low in saturated fats and simple carbohydrates. I recommended getting exercise daily and vigorous exercise 3 times per week to include a total of at least 150 minutes per week.    Follow-up office visit in September for preventative care visit.  Obesity, Adult Obesity is the condition of having too much total body fat. Being overweight or obese means that your weight is greater than what is considered healthy for your body size. Obesity is determined by a measurement called BMI. BMI is an estimate of body fat and is calculated from height and weight. For adults, a BMI of 30 or higher is considered obese. Obesity can lead to other health concerns and major illnesses, including:  Stroke.  Coronary artery disease (CAD).  Type 2 diabetes.  Some types of cancer, including cancers of the colon, breast, uterus, and gallbladder.  Osteoarthritis.  High blood pressure (hypertension).  High cholesterol.  Sleep apnea.  Gallbladder stones.  Infertility problems. What are the causes? Common causes of this condition include:  Eating daily meals that are high in calories, sugar, and fat.  Being born with genes that may make  you more likely to become obese.  Having a medical condition that causes obesity, including: ? Hypothyroidism. ? Polycystic ovarian syndrome (PCOS). ? Binge-eating disorder. ? Cushing syndrome.  Taking certain medicines, such as steroids, antidepressants, and seizure medicines.  Not being physically active (sedentary lifestyle).  Not getting enough sleep.  Drinking high amounts of sugar-sweetened beverages, such as soft drinks. What increases the risk? The following factors may make you more likely to develop this condition:  Having a family history of obesity.  Being a woman of African American descent.  Being a man of Hispanic descent.  Living in an area with limited access to: ? Romilda Garret, recreation centers, or sidewalks. ? Healthy food choices, such as grocery stores and farmers' markets. What are the signs or symptoms? The main sign of this condition is having too much body fat. How is this diagnosed? This condition is diagnosed based on:  Your BMI. If you are an adult with a BMI of 30 or higher, you are considered obese.  Your waist circumference. This measures the distance around your waistline.  Your skinfold thickness. Your health care provider may gently pinch a fold of your skin and measure it. You may have other tests to check for underlying conditions. How is this treated? Treatment for this condition often includes changing your lifestyle. Treatment may include some or all of the following:  Dietary changes. This may include developing a healthy meal plan.  Regular physical activity. This may include  activity that causes your heart to beat faster (aerobic exercise) and strength training. Work with your health care provider to design an exercise program that works for you.  Medicine to help you lose weight if you are unable to lose 1 pound a week after 6 weeks of healthy eating and more physical activity.  Treating conditions that cause the obesity (underlying  conditions).  Surgery. Surgical options may include gastric banding and gastric bypass. Surgery may be done if: ? Other treatments have not helped to improve your condition. ? You have a BMI of 40 or higher. ? You have life-threatening health problems related to obesity. Follow these instructions at home: Eating and drinking   Follow recommendations from your health care provider about what you eat and drink. Your health care provider may advise you to: ? Limit fast food, sweets, and processed snack foods. ? Choose low-fat options, such as low-fat milk instead of whole milk. ? Eat 5 or more servings of fruits or vegetables every day. ? Eat at home more often. This gives you more control over what you eat. ? Choose healthy foods when you eat out. ? Learn to read food labels. This will help you understand how much food is considered 1 serving. ? Learn what a healthy serving size is. ? Keep low-fat snacks available. ? Limit sugary drinks, such as soda, fruit juice, sweetened iced tea, and flavored milk.  Drink enough water to keep your urine pale yellow.  Do not follow a fad diet. Fad diets can be unhealthy and even dangerous. Physical activity  Exercise regularly, as told by your health care provider. ? Most adults should get up to 150 minutes of moderate-intensity exercise every week. ? Ask your health care provider what types of exercise are safe for you and how often you should exercise.  Warm up and stretch before being active.  Cool down and stretch after being active.  Rest between periods of activity. Lifestyle  Work with your health care provider and a dietitian to set a weight-loss goal that is healthy and reasonable for you.  Limit your screen time.  Find ways to reward yourself that do not involve food.  Do not drink alcohol if: ? Your health care provider tells you not to drink. ? You are pregnant, may be pregnant, or are planning to become pregnant.  If you  drink alcohol: ? Limit how much you use to:  0-1 drink a day for women.  0-2 drinks a day for men. ? Be aware of how much alcohol is in your drink. In the [place] one drink equals one 12 oz bottle of beer (355 mL), one 5 oz glass of wine (148 mL), or one 1 oz glass of hard liquor (44 mL). General instructions  Keep a weight-loss journal to keep track of the food you eat and how much exercise you get.  Take over-the-counter and prescription medicines only as told by your health care provider.  Take vitamins and supplements only as told by your health care provider.  Consider joining a support group. Your health care provider may be able to recommend a support group.  Keep all follow-up visits as told by your health care provider. This is important. Contact a health care provider if:  You are unable to meet your weight loss goal after 6 weeks of dietary and lifestyle changes. Get help right away if you are having:  Trouble breathing.  Suicidal thoughts or behaviors. Summary  Obesity is the condition  of having too much total body fat.  Being overweight or obese means that your weight is greater than what is considered healthy for your body size.  Work with your health care provider and a dietitian to set a weight-loss goal that is healthy and reasonable for you.  Exercise regularly, as told by your health care provider. Ask your health care provider what types of exercise are safe for you and how often you should exercise. This information is not intended to replace advice given to you by your health care provider. Make sure you discuss any questions you have with your health care provider. Document Revised: 06/28/2018 Document Reviewed: 06/28/2018 Elsevier Patient Education  Jamul DASH stands for "Dietary Approaches to Stop Hypertension." The DASH eating plan is a healthy eating plan that has been shown to reduce high blood pressure (hypertension).  It may also reduce your risk for type 2 diabetes, heart disease, and stroke. The DASH eating plan may also help with weight loss. What are tips for following this plan?  General guidelines  Avoid eating more than 2,300 mg (milligrams) of salt (sodium) a day. If you have hypertension, you may need to reduce your sodium intake to 1,500 mg a day.  Limit alcohol intake to no more than 1 drink a day for nonpregnant women and 2 drinks a day for men. One drink equals 12 oz of beer, 5 oz of wine, or 1 oz of hard liquor.  Work with your health care provider to maintain a healthy body weight or to lose weight. Ask what an ideal weight is for you.  Get at least 30 minutes of exercise that causes your heart to beat faster (aerobic exercise) most days of the week. Activities may include walking, swimming, or biking.  Work with your health care provider or diet and nutrition specialist (dietitian) to adjust your eating plan to your individual calorie needs. Reading food labels   Check food labels for the amount of sodium per serving. Choose foods with less than 5 percent of the Daily Value of sodium. Generally, foods with less than 300 mg of sodium per serving fit into this eating plan.  To find whole grains, look for the word "whole" as the first word in the ingredient list. Shopping  Buy products labeled as "low-sodium" or "no salt added."  Buy fresh foods. Avoid canned foods and premade or frozen meals. Cooking  Avoid adding salt when cooking. Use salt-free seasonings or herbs instead of table salt or sea salt. Check with your health care provider or pharmacist before using salt substitutes.  Do not fry foods. Cook foods using healthy methods such as baking, boiling, grilling, and broiling instead.  Cook with heart-healthy oils, such as olive, canola, soybean, or sunflower oil. Meal planning  Eat a balanced diet that includes: ? 5 or more servings of fruits and vegetables each day. At each  meal, try to fill half of your plate with fruits and vegetables. ? Up to 6-8 servings of whole grains each day. ? Less than 6 oz of lean meat, poultry, or fish each day. A 3-oz serving of meat is about the same size as a deck of cards. One egg equals 1 oz. ? 2 servings of low-fat dairy each day. ? A serving of nuts, seeds, or beans 5 times each week. ? Heart-healthy fats. Healthy fats called Omega-3 fatty acids are found in foods such as flaxseeds and coldwater fish, like sardines, salmon, and  mackerel.  Limit how much you eat of the following: ? Canned or prepackaged foods. ? Food that is high in trans fat, such as fried foods. ? Food that is high in saturated fat, such as fatty meat. ? Sweets, desserts, sugary drinks, and other foods with added sugar. ? Full-fat dairy products.  Do not salt foods before eating.  Try to eat at least 2 vegetarian meals each week.  Eat more home-cooked food and less restaurant, buffet, and fast food.  When eating at a restaurant, ask that your food be prepared with less salt or no salt, if possible. What foods are recommended? The items listed may not be a complete list. Talk with your dietitian about what dietary choices are best for you. Grains Whole-grain or whole-wheat bread. Whole-grain or whole-wheat pasta. Brown rice. Modena Morrow. Bulgur. Whole-grain and low-sodium cereals. Pita bread. Low-fat, low-sodium crackers. Whole-wheat flour tortillas. Vegetables Fresh or frozen vegetables (raw, steamed, roasted, or grilled). Low-sodium or reduced-sodium tomato and vegetable juice. Low-sodium or reduced-sodium tomato sauce and tomato paste. Low-sodium or reduced-sodium canned vegetables. Fruits All fresh, dried, or frozen fruit. Canned fruit in natural juice (without added sugar). Meat and other protein foods Skinless chicken or [place]. Ground chicken or [place]. Pork with fat trimmed off. Fish and seafood. Egg whites. Dried beans, peas, or lentils.  Unsalted nuts, nut butters, and seeds. Unsalted canned beans. Lean cuts of beef with fat trimmed off. Low-sodium, lean deli meat. Dairy Low-fat (1%) or fat-free (skim) milk. Fat-free, low-fat, or reduced-fat cheeses. Nonfat, low-sodium ricotta or cottage cheese. Low-fat or nonfat yogurt. Low-fat, low-sodium cheese. Fats and oils Soft margarine without trans fats. Vegetable oil. Low-fat, reduced-fat, or light mayonnaise and salad dressings (reduced-sodium). Canola, safflower, olive, soybean, and sunflower oils. Avocado. Seasoning and other foods Herbs. Spices. Seasoning mixes without salt. Unsalted popcorn and pretzels. Fat-free sweets. What foods are not recommended? The items listed may not be a complete list. Talk with your dietitian about what dietary choices are best for you. Grains Baked goods made with fat, such as croissants, muffins, or some breads. Dry pasta or rice meal packs. Vegetables Creamed or fried vegetables. Vegetables in a cheese sauce. Regular canned vegetables (not low-sodium or reduced-sodium). Regular canned tomato sauce and paste (not low-sodium or reduced-sodium). Regular tomato and vegetable juice (not low-sodium or reduced-sodium). Angie Fava. Olives. Fruits Canned fruit in a light or heavy syrup. Fried fruit. Fruit in cream or butter sauce. Meat and other protein foods Fatty cuts of meat. Ribs. Fried meat. Berniece Salines. Sausage. Bologna and other processed lunch meats. Salami. Fatback. Hotdogs. Bratwurst. Salted nuts and seeds. Canned beans with added salt. Canned or smoked fish. Whole eggs or egg yolks. Chicken or [place] with skin. Dairy Whole or 2% milk, cream, and half-and-half. Whole or full-fat cream cheese. Whole-fat or sweetened yogurt. Full-fat cheese. Nondairy creamers. Whipped toppings. Processed cheese and cheese spreads. Fats and oils Butter. Stick margarine. Lard. Shortening. Ghee. Bacon fat. Tropical oils, such as coconut, palm kernel, or palm oil. Seasoning and  other foods Salted popcorn and pretzels. Onion salt, garlic salt, seasoned salt, table salt, and sea salt. Worcestershire sauce. Tartar sauce. Barbecue sauce. Teriyaki sauce. Soy sauce, including reduced-sodium. Steak sauce. Canned and packaged gravies. Fish sauce. Oyster sauce. Cocktail sauce. Horseradish that you find on the shelf. Ketchup. Mustard. Meat flavorings and tenderizers. Bouillon cubes. Hot sauce and Tabasco sauce. Premade or packaged marinades. Premade or packaged taco seasonings. Relishes. Regular salad dressings. Where to find more information:  National Heart, Lung,  and Blood Institute: https://wilson-eaton.com/  American Heart Association: www.heart.org Summary  The DASH eating plan is a healthy eating plan that has been shown to reduce high blood pressure (hypertension). It may also reduce your risk for type 2 diabetes, heart disease, and stroke.  With the DASH eating plan, you should limit salt (sodium) intake to 2,300 mg a day. If you have hypertension, you may need to reduce your sodium intake to 1,500 mg a day.  When on the DASH eating plan, aim to eat more fresh fruits and vegetables, whole grains, lean proteins, low-fat dairy, and heart-healthy fats.  Work with your health care provider or diet and nutrition specialist (dietitian) to adjust your eating plan to your individual calorie needs. This information is not intended to replace advice given to you by your health care provider. Make sure you discuss any questions you have with your health care provider. Document Revised: 10/06/2017 Document Reviewed: 10/17/2016 Elsevier Patient Education  2020 [employer].

## 2020-03-18 ENCOUNTER — Encounter: Payer: Self-pay | Admitting: Nurse Practitioner

## 2020-03-18 DIAGNOSIS — R0683 Snoring: Secondary | ICD-10-CM

## 2020-03-18 DIAGNOSIS — G479 Sleep disorder, unspecified: Secondary | ICD-10-CM | POA: Insufficient documentation

## 2020-03-18 DIAGNOSIS — R7989 Other specified abnormal findings of blood chemistry: Secondary | ICD-10-CM | POA: Insufficient documentation

## 2020-03-18 DIAGNOSIS — Z6841 Body Mass Index (BMI) 40.0 and over, adult: Secondary | ICD-10-CM

## 2020-03-18 HISTORY — DX: Sleep disorder, unspecified: G47.9

## 2020-03-18 HISTORY — DX: Snoring: R06.83

## 2020-03-18 HISTORY — DX: Other specified abnormal findings of blood chemistry: R79.89

## 2020-03-18 HISTORY — DX: Body Mass Index (BMI) 40.0 and over, adult: Z684

## 2020-05-12 ENCOUNTER — Other Ambulatory Visit: Payer: Self-pay

## 2020-05-12 ENCOUNTER — Ambulatory Visit (INDEPENDENT_AMBULATORY_CARE_PROVIDER_SITE_OTHER): Payer: 59 | Admitting: Adult Health

## 2020-05-12 ENCOUNTER — Encounter: Payer: Self-pay | Admitting: Adult Health

## 2020-05-12 VITALS — BP 122/70 | HR 66 | Temp 97.7°F | Ht 64.0 in | Wt 230.4 lb

## 2020-05-12 DIAGNOSIS — Z6841 Body Mass Index (BMI) 40.0 and over, adult: Secondary | ICD-10-CM | POA: Diagnosis not present

## 2020-05-12 DIAGNOSIS — G4719 Other hypersomnia: Secondary | ICD-10-CM | POA: Insufficient documentation

## 2020-05-12 NOTE — Assessment & Plan Note (Signed)
Healthy weight loss 

## 2020-05-12 NOTE — Assessment & Plan Note (Signed)
Excessive daytime sleepiness, snoring, morbid obesity, restless sleep are all increased risk factors for underlying sleep apnea.  Patient has a high Epworth score.  We will set her up for a home sleep study.  Patient education on healthy sleep regimen.  Advised on not driving if sleepy.  Plan  Patient Instructions  Set up for home sleep study Healthy sleep regimen Follow-up in 2 to 3 months with Dr. Mortimer Fries and As needed

## 2020-05-12 NOTE — Progress Notes (Signed)
@Patient  ID: Kathryn Harding, female    DOB: Mar 23, 1981, 39 y.o.   MRN: [id number]  Chief Complaint  Patient presents with   sleep consult    Referring provider: Marval Regal, NP  HPI: 39 year old female presents May 12, 2020 for sleep consult for daytime sleepiness and restless sleep with snoring.  TEST/EVENTS :   05/12/2020 Sleep Consult  Patient presents for a sleep consult.  Patient has a daytime sleepiness, snoring and restless sleep.  She goes to bed each night around 9 PM gets up in the morning about 6:30 AM.  But does not feel refreshed and feels sleepy throughout the day.  Epworth score is 13 today.  Patient states she had a sleep study a few years ago but had trouble sleeping in the sleep lab.  And was told she did not have sleep apnea. Has 1 soda daily . Watches TV at bedtime. Can fall asleep 10 min.  Naps x 2 daily for on avg 30 min to 2 hr each.  Denies sleep paralysis or cataplexy.  Truck driver, has [employer].   Says was diagnosed with TIA in past.    Surgical Hx : Cholecystectomy . , Ovarian cyst removal  Left knee surgery   SH: Truck driver. Married . No children.  Never smoke. Social etoh . No drugs.  Was in [employer] x 7 yrs . Goes to [employer] .  No pets, no hobbies.   FH : mother HTN.  Dad -COPD  Uncle cancer ?     Allergies  Allergen Reactions   Sulfur Other (See Comments)    HEADACHES      There is no immunization history on file for this patient.  Past Medical History:  Diagnosis Date   Aphasia 12/10/2011   Bell's palsy    Bell's palsy    Lt side of face   BMI 40.0-44.9, adult ([place]) 03/18/2020   CVA (cerebral infarction)    Gallbladder pain    Heavy menses    Low vitamin D level 03/18/2020   Sleep disorder 03/18/2020   Snores 03/18/2020   TIA (transient ischemic attack) 12/10/2011   TIA (transient ischemic attack)     Tobacco History: Social History   Tobacco Use  Smoking Status Never Smoker  Smokeless Tobacco  Never Used   Counseling given: Not Answered   Outpatient Medications Prior to Visit  Medication Sig Dispense Refill   famotidine (PEPCID) 20 MG tablet Take 1 tablet (20 mg total) by mouth at bedtime. 30 tablet 0   fexofenadine (ALLEGRA ALLERGY) 180 MG tablet Take 2 times daily for 1 week, then take 1 time daily. 90 tablet 1   triamcinolone cream (KENALOG) 0.1 % Apply 1 application topically 2 (two) times daily. For 7 days. 30 g 0   Vitamin D, Ergocalciferol, (DRISDOL) 1.25 MG (50000 UT) CAPS capsule Take 1 capsule (50,000 Units total) by mouth every 7 (seven) days. 12 capsule 0   No facility-administered medications prior to visit.     Review of Systems:   Constitutional:   No  weight loss, night sweats,  Fevers, chills,  +fatigue, or  lassitude.  HEENT:   No headaches,  Difficulty swallowing,  Tooth/dental problems, or  Sore throat,                No sneezing, itching, ear ache, nasal congestion, post nasal drip,   CV:  No chest pain,  Orthopnea, PND, swelling in lower extremities, anasarca, dizziness, palpitations, syncope.  GI  No heartburn, indigestion, abdominal pain, nausea, vomiting, diarrhea, change in bowel habits, loss of appetite, bloody stools.   Resp: No shortness of breath with exertion or at rest.  No excess mucus, no productive cough,  No non-productive cough,  No coughing up of blood.  No change in color of mucus.  No wheezing.  No chest wall deformity  Skin: no rash or lesions.  GU: no dysuria, change in color of urine, no urgency or frequency.  No flank pain, no hematuria   MS:  No joint pain or swelling.  No decreased range of motion.  No back pain.    Physical Exam  BP 122/70 (BP Location: Left Arm, Cuff Size: Large)    Pulse 66    Temp 97.7 F (36.5 C) (Temporal)    Ht 5\' 4"  (1.626 m)    Wt 230 lb 6.4 oz (104.5 kg)    SpO2 98%    BMI 39.55 kg/m   GEN: A/Ox3; pleasant , NAD, bmi 39.5    HEENT:  [place]/AT,  NOSE-clear, THROAT-clear, no lesions, no  postnasal drip or exudate noted. Class 3 MP airway   NECK:  Supple w/ fair ROM; no JVD; normal carotid impulses w/o bruits; no thyromegaly or nodules palpated; no lymphadenopathy.    RESP  Clear  P & A; w/o, wheezes/ rales/ or rhonchi. no accessory muscle use, no dullness to percussion  CARD:  RRR, no m/r/g, no peripheral edema, pulses intact, no cyanosis or clubbing.  GI:   Soft & nt; nml bowel sounds; no organomegaly or masses detected.   Musco: Warm bil, no deformities or joint swelling noted.   Neuro: alert, no focal deficits noted.    Skin: Warm, no lesions or rashes    Lab Results:  CBC    Component Value Date/Time   WBC 9.2 03/03/2020 0949   WBC 8.8 11/23/2018 0930   RBC 4.47 03/03/2020 0949   RBC 4.18 11/23/2018 0930   HGB 13.4 03/03/2020 0949   HCT 39.7 03/03/2020 0949   PLT 294 03/03/2020 0949   MCV 89 03/03/2020 0949   MCH 30.0 03/03/2020 0949   MCH 29.4 11/23/2018 0930   MCHC 33.8 03/03/2020 0949   MCHC 32.7 11/23/2018 0930   RDW 13.0 03/03/2020 0949   LYMPHSABS 2.5 03/03/2020 0949   MONOABS 0.7 10/03/2017 1405   EOSABS 0.1 03/03/2020 0949   BASOSABS 0.0 03/03/2020 0949    BMET    Component Value Date/Time   NA 137 03/03/2020 0949   K 4.6 03/03/2020 0949   CL 103 03/03/2020 0949   CO2 21 03/03/2020 0949   GLUCOSE 92 03/03/2020 0949   GLUCOSE 93 11/23/2018 0930   BUN 15 03/03/2020 0949   CREATININE 0.78 03/03/2020 0949   CALCIUM 10.0 03/03/2020 0949   GFRNONAA 96 03/03/2020 0949   GFRAA 111 03/03/2020 0949    BNP No results found for: BNP  ProBNP No results found for: PROBNP  Imaging: No results found.    No flowsheet data found.  No results found for: NITRICOXIDE      Assessment & Plan:   Excessive daytime sleepiness Excessive daytime sleepiness, snoring, morbid obesity, restless sleep are all increased risk factors for underlying sleep apnea.  Patient has a high Epworth score.  We will set her up for a home sleep study.   Patient education on healthy sleep regimen.  Advised on not driving if sleepy.  Plan  Patient Instructions  Set up for home sleep study Healthy  sleep regimen Follow-up in 2 to 3 months with Dr. Mortimer Fries and As needed        BMI 40.0-44.9, adult ([place]) Healthy weight loss.       Rexene Edison, NP 05/12/2020

## 2020-05-12 NOTE — Patient Instructions (Addendum)
Set up for home sleep study Healthy sleep regimen Follow-up in 2 to 3 months with Dr. Mortimer Fries and As needed

## 2020-05-12 NOTE — Progress Notes (Signed)
Reviewed and agree with assessment/plan.   Chesley Mires, MD [facility] Pulmonary/Critical Care 05/12/2020, 2:41 PM Pager:  [phone]

## 2020-06-03 IMAGING — CT CT CERVICAL SPINE WITHOUT CONTRAST
3 of 4 series · 10 of 33 positions shown, 12 images · non-contrast
Comparison: None.

CLINICAL DATA: Neck pain, initial exam

EXAM:
CT CERVICAL SPINE WITHOUT CONTRAST
TECHNIQUE: Multidetector CT imaging of the cervical spine was performed without
intravenous contrast. Multiplanar CT image reconstructions were also
generated.

[Series 4: sagittal bone · sagittal · 0.20mm/px · 5 of 66 slices shown, 6 images]
[im 22/66  bone]
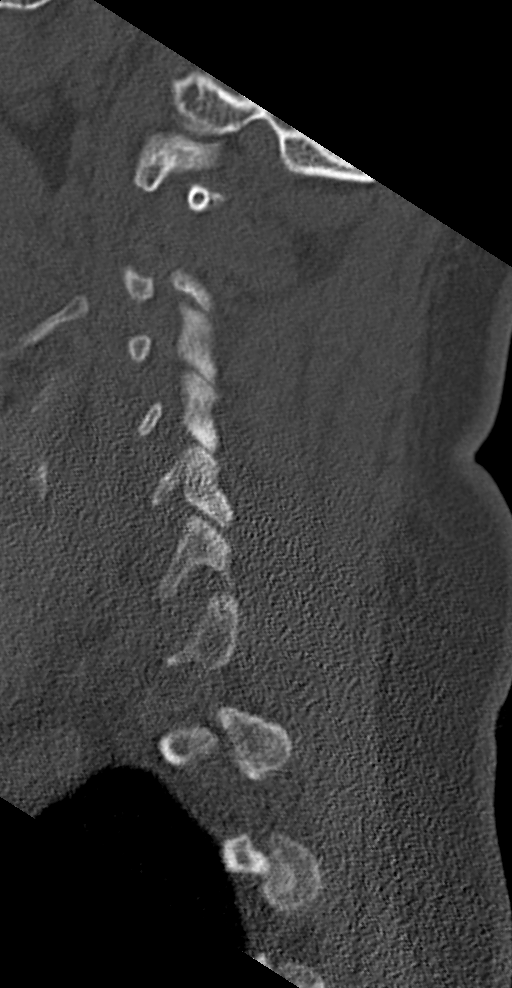
[im 28/66  bone]
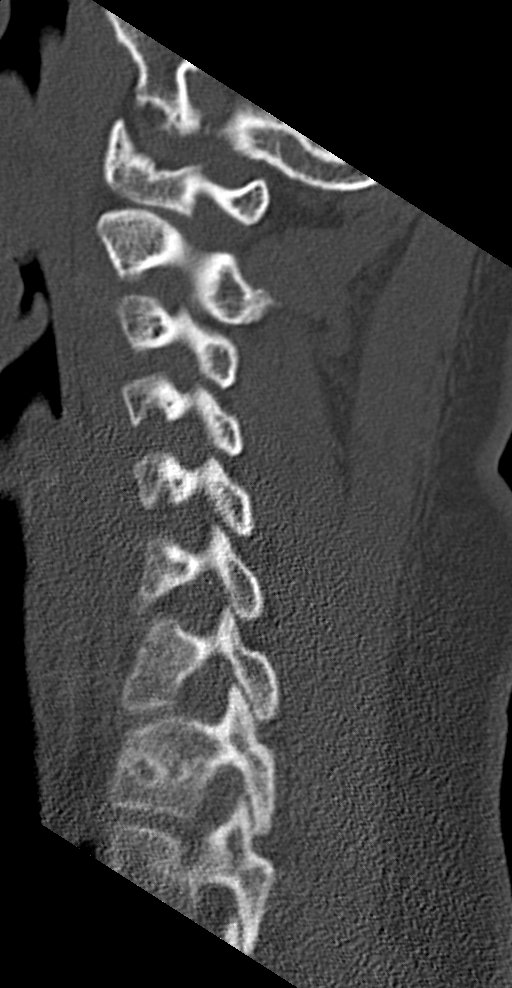
[im 33/66  soft-tissue]
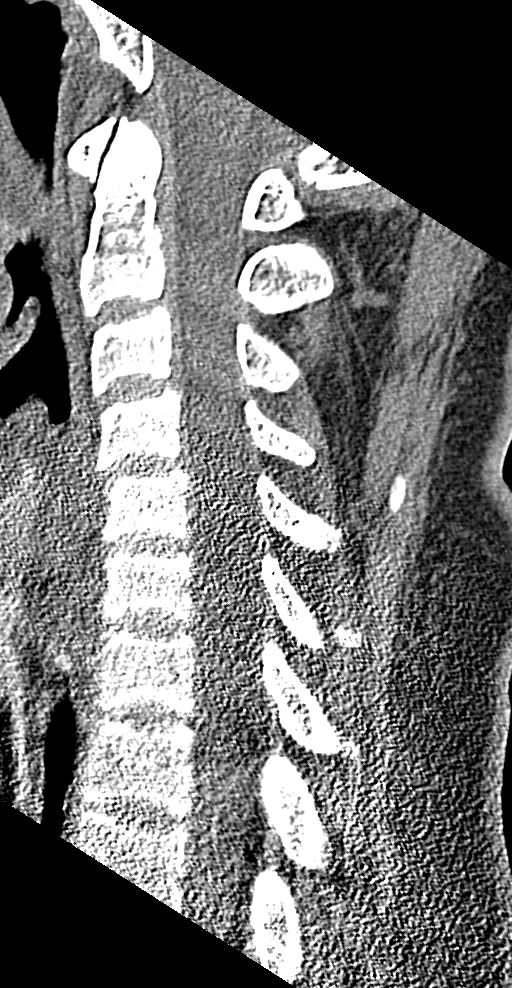
[im 33/66  bone]
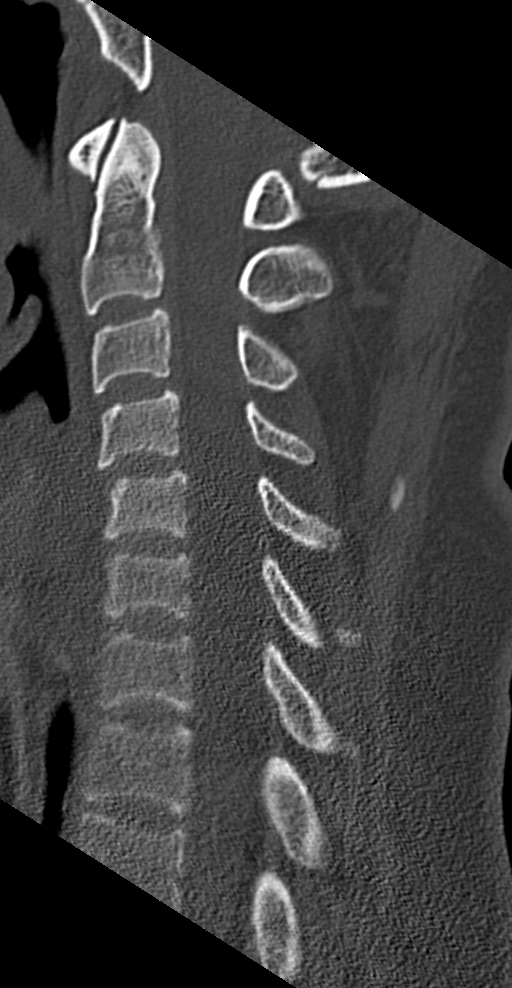
[im 38/66  bone]
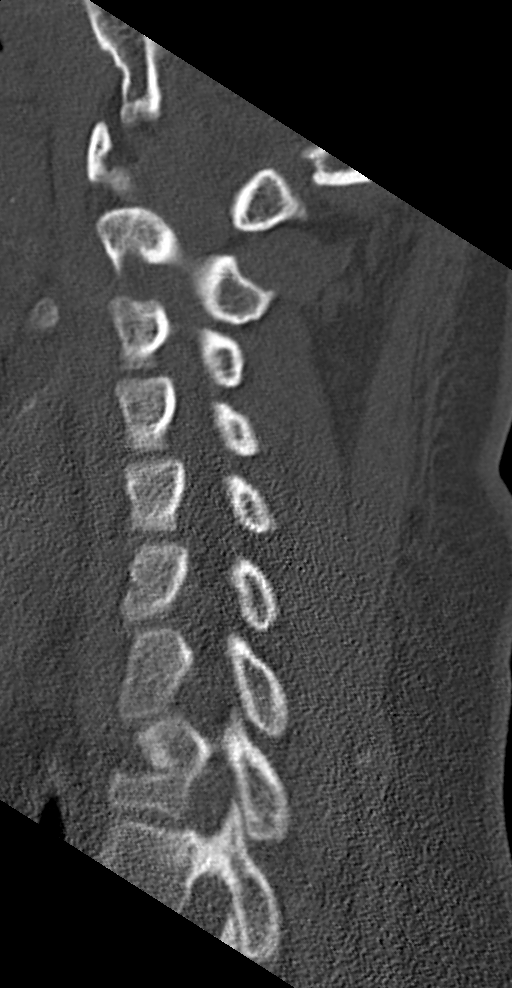
[im 44/66  bone]
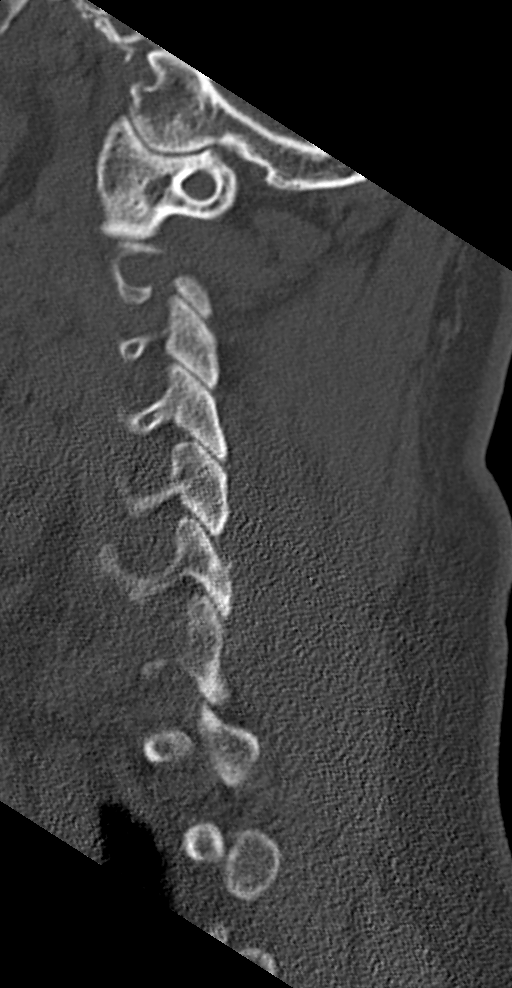

[Series 5: coronal bone · coronal · 0.25mm/px · 3 of 51 slices shown]
[im 12/51  bone]
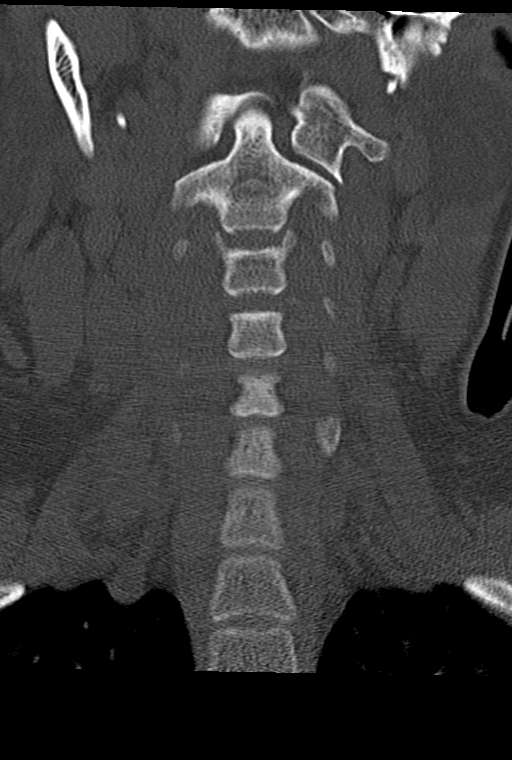
[im 21/51  bone]
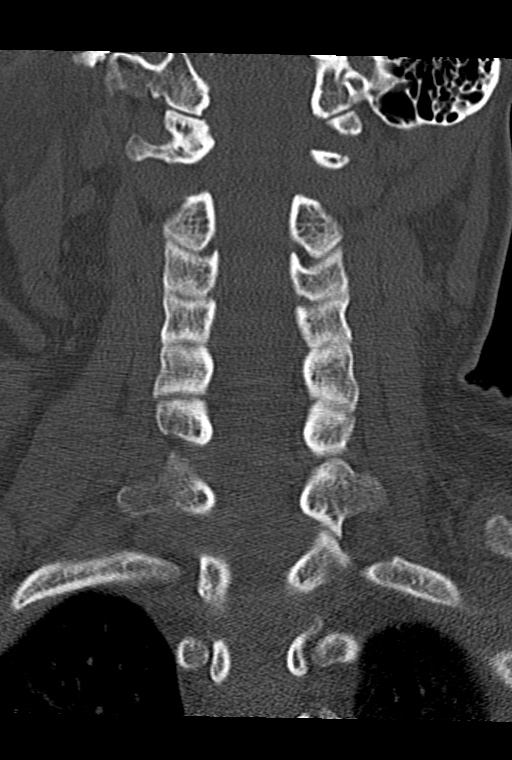
[im 30/51  bone]
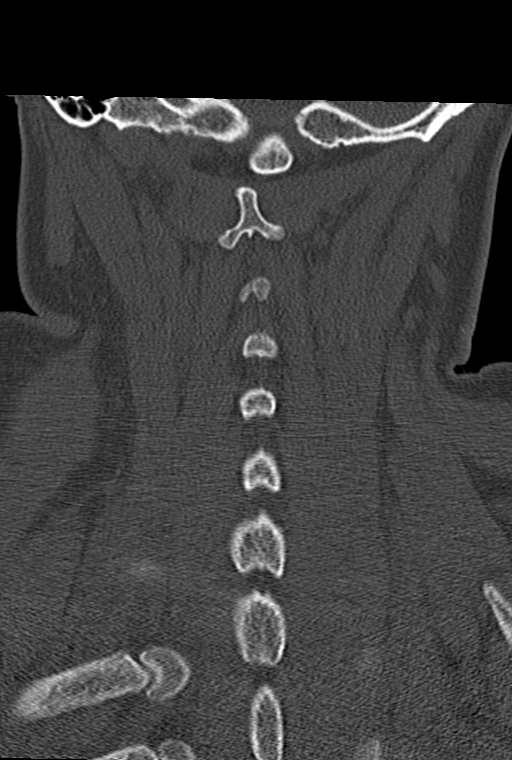

[Series 6: orthogonal bone · axial · 0.20mm/px · z∈[-258,-189]mm · 2 of 97 slices shown, 3 images]
[im 28/97  soft-tissue]
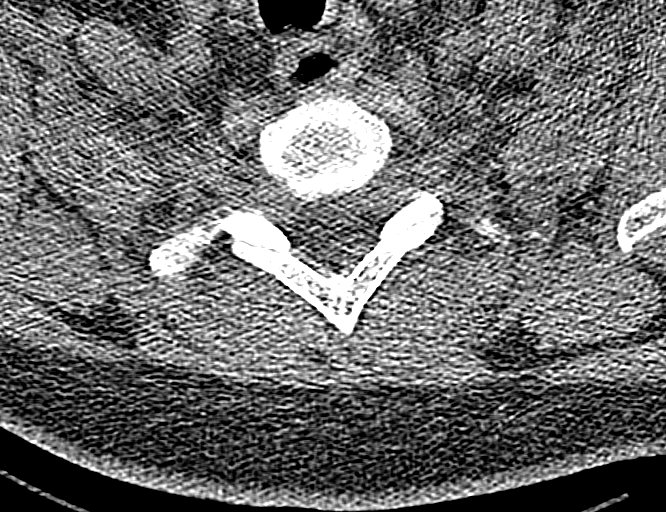
[im 28/97  bone]
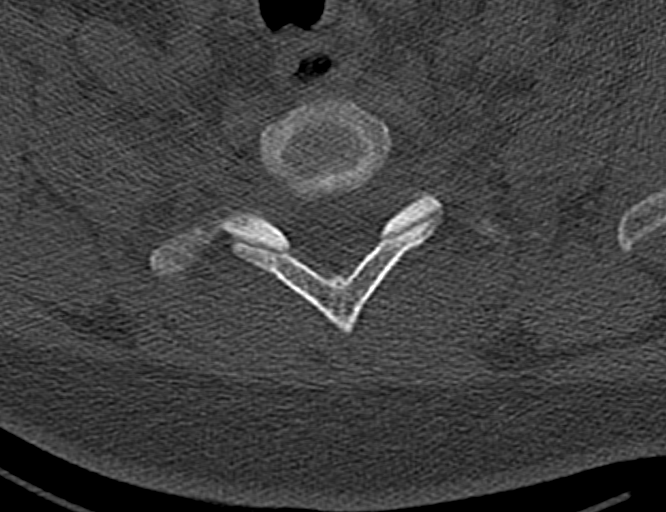
[im 69/97  bone]
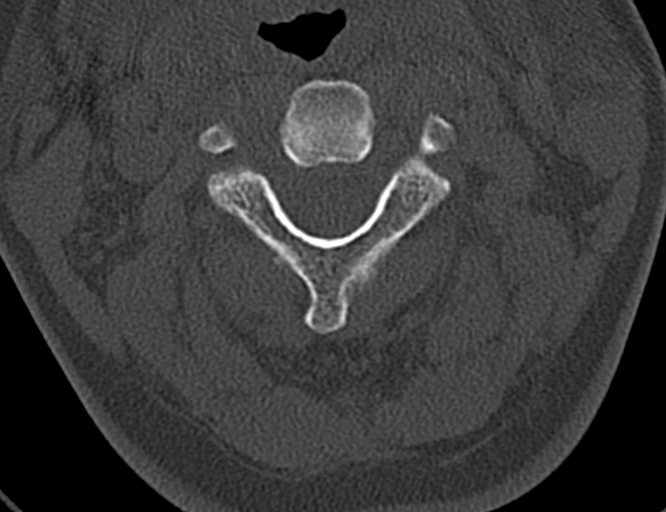

[10 of 33 positions shown; findings below may reference images not displayed]

FINDINGS: Alignment: Normal

Skull base and vertebrae: Negative for fracture or lesion.

Soft tissues and spinal canal: No prevertebral fluid or swelling. No
visible canal hematoma.

Disc levels:  Negative

Upper chest: Negative
IMPRESSION: Negative cervical spine CT.

## 2020-06-10 ENCOUNTER — Ambulatory Visit: Payer: 59

## 2020-06-10 ENCOUNTER — Other Ambulatory Visit: Payer: Self-pay

## 2020-06-10 DIAGNOSIS — G4733 Obstructive sleep apnea (adult) (pediatric): Secondary | ICD-10-CM

## 2020-06-10 DIAGNOSIS — G4719 Other hypersomnia: Secondary | ICD-10-CM

## 2020-06-24 DIAGNOSIS — G4733 Obstructive sleep apnea (adult) (pediatric): Secondary | ICD-10-CM

## 2020-07-09 ENCOUNTER — Encounter: Payer: Self-pay | Admitting: Nurse Practitioner

## 2020-07-10 ENCOUNTER — Other Ambulatory Visit: Payer: Self-pay

## 2020-07-10 ENCOUNTER — Ambulatory Visit: Payer: 59 | Admitting: Adult Health

## 2020-07-10 ENCOUNTER — Encounter: Payer: Self-pay | Admitting: Adult Health

## 2020-07-10 VITALS — BP 100/70 | HR 52 | Temp 98.0°F | Ht 64.5 in | Wt 228.2 lb

## 2020-07-10 DIAGNOSIS — G4733 Obstructive sleep apnea (adult) (pediatric): Secondary | ICD-10-CM | POA: Diagnosis not present

## 2020-07-10 DIAGNOSIS — K0889 Other specified disorders of teeth and supporting structures: Secondary | ICD-10-CM | POA: Insufficient documentation

## 2020-07-10 MED ORDER — AMOXICILLIN 500 MG PO CAPS
500.0000 mg | ORAL_CAPSULE | Freq: Three times a day (TID) | ORAL | 0 refills | Status: AC
Start: 2020-07-10 — End: ?

## 2020-07-10 NOTE — Assessment & Plan Note (Signed)
Very mild obstructive sleep apnea but with significant symptom burden.  Patient is a Administrator.  We discussed patient education on sleep apnea potential complications and education on safety with driving.  Patient would like to begin CPAP.  Patient education on CPAP was given. We will begin auto CPAP 5 to 15 cm H2O.  Enroll in [employer] and mask of choice.  Plan  Patient Instructions  Begin CPAP At bedtime  .  Work on healthy weight loss.  Wear CPAP all night .  Do not drive if sleepy  Amoxicillin 500mg  Three times a day  For 7 days  Follow up with dentist next week as discussed.  Follow up in office in 2 months with Dr. Halford Chessman  Or Lujean Ebright NP  Please contact office for sooner follow up if symptoms do not improve or worsen or seek emergency care

## 2020-07-10 NOTE — Progress Notes (Signed)
Reviewed and agree with assessment/plan.   Chesley Mires, MD [facility] 07/10/2020, 12:09 PM Pager:  [phone]

## 2020-07-10 NOTE — Progress Notes (Signed)
@Patient  ID: Kathryn Harding, female    DOB: 11-27-1980, 39 y.o.   MRN: [id number]  Chief Complaint  Patient presents with  . Follow-up    OSA     Referring provider: Marval Regal, NP  HPI: 39 year old female seen for sleep consult May 12, 2020 for daytime sleepiness and restless sleep set up for a home sleep study that showed very mild sleep apnea with AHI at 5.  2. Truck Driver   TEST/EVENTS :   07/10/2020 Follow up : OSA  Patient presents for a 64-month follow-up.  Patient was seen last visit for a sleep consult for daytime sleepiness and restless sleep.  She was set up for a home sleep study that showed very mild sleep apnea at AHI 5.2 and SPO2 low of 88%.  We discussed her test results in detail.  Patient education on sleep apnea and potential treatment options.  Went over weight loss, CPAP and oral appliance.  Patient would like to proceed with CPAP . Says she has so much sleepiness and restless sleep . Wants something to help with this. She is a Administrator as well.   Complains over last few days has right lower jaw pain , having to eat soft foods because hurts her to chew and open mouth. No rash, dysphagia, fever or visual/speech changes.  Unsure if tooth issue but hurts to eat.  Hurts into ears .   Allergies  Allergen Reactions  . Sulfur Other (See Comments)    HEADACHES     Immunization History  Administered Date(s) Administered  . Moderna SARS-COVID-2 Vaccination 06/06/2020, 06/08/2020    Past Medical History:  Diagnosis Date  . Aphasia 12/10/2011  . Bell's palsy   . Bell's palsy    Lt side of face  . BMI 40.0-44.9, adult ([place]) 03/18/2020  . CVA (cerebral infarction)   . Gallbladder pain   . Heavy menses   . Low vitamin D level 03/18/2020  . Sleep disorder 03/18/2020  . Snores 03/18/2020  . TIA (transient ischemic attack) 12/10/2011  . TIA (transient ischemic attack)     Tobacco History: Social History   Tobacco Use  Smoking Status Never Smoker    Smokeless Tobacco Never Used   Counseling given: Not Answered   Outpatient Medications Prior to Visit  Medication Sig Dispense Refill  . triamcinolone cream (KENALOG) 0.1 % Apply 1 application topically 2 (two) times daily. For 7 days. 30 g 0  . famotidine (PEPCID) 20 MG tablet Take 1 tablet (20 mg total) by mouth at bedtime. 30 tablet 0  . fexofenadine (ALLEGRA ALLERGY) 180 MG tablet Take 2 times daily for 1 week, then take 1 time daily. 90 tablet 1  . Vitamin D, Ergocalciferol, (DRISDOL) 1.25 MG (50000 UT) CAPS capsule Take 1 capsule (50,000 Units total) by mouth every 7 (seven) days. 12 capsule 0   No facility-administered medications prior to visit.     Review of Systems:   Constitutional:   No  weight loss, night sweats,  Fevers, chills, + fatigue, or  lassitude.  HEENT:   No headaches,  Difficulty swallowing,  +Tooth/dental problems, or no  Sore throat,                No sneezing, itching, ear ache, nasal congestion, post nasal drip,   CV:  No chest pain,  Orthopnea, PND, swelling in lower extremities, anasarca, dizziness, palpitations, syncope.   GI  No heartburn, indigestion, abdominal pain, nausea, vomiting, diarrhea, change in bowel  habits, loss of appetite, bloody stools.   Resp: No shortness of breath with exertion or at rest.  No excess mucus, no productive cough,  No non-productive cough,  No coughing up of blood.  No change in color of mucus.  No wheezing.  No chest wall deformity  Skin: no rash or lesions.  GU: no dysuria, change in color of urine, no urgency or frequency.  No flank pain, no hematuria   MS:  No joint pain or swelling.  No decreased range of motion.  No back pain.    Physical Exam  BP 100/70 (BP Location: Left Arm, Cuff Size: Normal)   Pulse (!) 52   Temp 98 F (36.7 C) (Temporal)   Ht 5' 4.5" (1.638 m)   Wt 228 lb 3.2 oz (103.5 kg)   SpO2 98% Comment: RA  BMI 38.57 kg/m   GEN: A/Ox3; pleasant , NAD, well nourished    HEENT:   [place]/AT,  EACs-clear, TMs-wnl, NOSE-clear, THROAT-clear, no lesions, no postnasal drip or exudate noted. No obvious dental issues,   NECK:  Supple w/ fair ROM; no JVD; normal carotid impulses w/o bruits; no thyromegaly or nodules palpated; no lymphadenopathy.  Neg rigidity . No palpable massess.   RESP  Clear  P & A; w/o, wheezes/ rales/ or rhonchi. no accessory muscle use, no dullness to percussion  CARD:  RRR, no m/r/g, no peripheral edema, pulses intact, no cyanosis or clubbing.  GI:   Soft & nt; nml bowel sounds; no organomegaly or masses detected.   Musco: Warm bil, no deformities or joint swelling noted.   Neuro: alert, no focal deficits noted.    Skin: Warm, no lesions or rashes    Lab Results:  CBC    Component Value Date/Time   WBC 9.2 03/03/2020 0949   WBC 8.8 11/23/2018 0930   RBC 4.47 03/03/2020 0949   RBC 4.18 11/23/2018 0930   HGB 13.4 03/03/2020 0949   HCT 39.7 03/03/2020 0949   PLT 294 03/03/2020 0949   MCV 89 03/03/2020 0949   MCH 30.0 03/03/2020 0949   MCH 29.4 11/23/2018 0930   MCHC 33.8 03/03/2020 0949   MCHC 32.7 11/23/2018 0930   RDW 13.0 03/03/2020 0949   LYMPHSABS 2.5 03/03/2020 0949   MONOABS 0.7 10/03/2017 1405   EOSABS 0.1 03/03/2020 0949   BASOSABS 0.0 03/03/2020 0949    BMET    Component Value Date/Time   NA 137 03/03/2020 0949   K 4.6 03/03/2020 0949   CL 103 03/03/2020 0949   CO2 21 03/03/2020 0949   GLUCOSE 92 03/03/2020 0949   GLUCOSE 93 11/23/2018 0930   BUN 15 03/03/2020 0949   CREATININE 0.78 03/03/2020 0949   CALCIUM 10.0 03/03/2020 0949   GFRNONAA 96 03/03/2020 0949   GFRAA 111 03/03/2020 0949    BNP No results found for: BNP  ProBNP No results found for: PROBNP  Imaging: No results found.    No flowsheet data found.  No results found for: NITRICOXIDE      Assessment & Plan:   OSA (obstructive sleep apnea) Very mild obstructive sleep apnea but with significant symptom burden.  Patient is a Dietitian.  We discussed patient education on sleep apnea potential complications and education on safety with driving.  Patient would like to begin CPAP.  Patient education on CPAP was given. We will begin auto CPAP 5 to 15 cm H2O.  Enroll in [employer] and mask of choice.  Plan  Patient Instructions  Begin CPAP At bedtime  .  Work on healthy weight loss.  Wear CPAP all night .  Do not drive if sleepy  Amoxicillin 500mg  Three times a day  For 7 days  Follow up with dentist next week as discussed.  Follow up in office in 2 months with Dr. Halford Chessman  Or Rhyder Bratz NP  Please contact office for sooner follow up if symptoms do not improve or worsen or seek emergency care       Pain, dental Right-sided jaw/dental pain patient has significant pain with eating and chewing.  Suspect she has underlying dental issue.  Exam was unrevealing. We will treat for possible dental infection.  Of advised her to follow-up with dentist early next week.  If symptoms not improve or worsen she is to seek emergency room or urgent care.  Plan  Patient Instructions  Begin CPAP At bedtime  .  Work on healthy weight loss.  Wear CPAP all night .  Do not drive if sleepy  Amoxicillin 500mg  Three times a day  For 7 days  Follow up with dentist next week as discussed.  Follow up in office in 2 months with Dr. Halford Chessman  Or Myalee Stengel NP  Please contact office for sooner follow up if symptoms do not improve or worsen or seek emergency care          Rexene Edison, NP 07/10/2020

## 2020-07-10 NOTE — Assessment & Plan Note (Signed)
Right-sided jaw/dental pain patient has significant pain with eating and chewing.  Suspect she has underlying dental issue.  Exam was unrevealing. We will treat for possible dental infection.  Of advised her to follow-up with dentist early next week.  If symptoms not improve or worsen she is to seek emergency room or urgent care.  Plan  Patient Instructions  Begin CPAP At bedtime  .  Work on healthy weight loss.  Wear CPAP all night .  Do not drive if sleepy  Amoxicillin 500mg  Three times a day  For 7 days  Follow up with dentist next week as discussed.  Follow up in office in 2 months with Dr. Halford Chessman  Or Brittnay Pigman NP  Please contact office for sooner follow up if symptoms do not improve or worsen or seek emergency care

## 2020-07-10 NOTE — Patient Instructions (Addendum)
Begin CPAP At bedtime  .  Work on healthy weight loss.  Wear CPAP all night .  Do not drive if sleepy  Amoxicillin 500mg  Three times a day  For 7 days  Follow up with dentist next week as discussed.  Follow up in office in 2 months with Dr. Halford Chessman  Or Ashlay Altieri NP  Please contact office for sooner follow up if symptoms do not improve or worsen or seek emergency care

## 2020-08-17 ENCOUNTER — Ambulatory Visit: Payer: 59 | Admitting: Nurse Practitioner

## 2020-09-11 ENCOUNTER — Ambulatory Visit: Payer: 59 | Admitting: Pulmonary Disease

## 2020-10-09 ENCOUNTER — Ambulatory Visit: Payer: 59 | Admitting: Pulmonary Disease

## 2021-09-01 ENCOUNTER — Telehealth: Payer: Self-pay | Admitting: Family Medicine

## 2021-09-01 NOTE — Telephone Encounter (Signed)
This patient is on my schedule for a "form completion" on Friday. I have not seen her previously. She was previously seeing Dawson Bills and has not been seen since May 2021. Can you find out what this form is for so I can determine if this it is something that I can actually fill out for her? She does need to establish with a new provider since Maudie Mercury is no longer here. I am not taking any new patients at this time and am unsure if anyone in the office is currently taking new patients.

## 2021-09-02 NOTE — Telephone Encounter (Signed)
Called the patient and VM was full.  Nikhita Mentzel,cma

## 2021-09-02 NOTE — Telephone Encounter (Signed)
I tried to call patient, but VM was full & unable to LM.

## 2021-09-02 NOTE — Telephone Encounter (Signed)
Please send a MyChart message as you were not able to get in touch with her.  Please see if she can let us know what the form is for so I can determine if it is something that I can actually fill out for her as I have not seen her previously and she has not been seen in clinic in over a year.

## 2021-09-03 ENCOUNTER — Ambulatory Visit: Payer: 59 | Admitting: Family Medicine

## 2021-09-03 ENCOUNTER — Telehealth: Payer: Self-pay

## 2021-09-03 NOTE — Telephone Encounter (Signed)
Noted. I should be able to fill this out for her.
